# Patient Record
Sex: Female | Born: 1945 | Race: White | Hispanic: No | Marital: Married | Smoking: Former smoker
Health system: Southern US, Community
[De-identification: ages and names within clinical notes are randomized; demographics above are authoritative.]

## PROBLEM LIST (undated history)

## (undated) DIAGNOSIS — F32A Depression, unspecified: Secondary | ICD-10-CM

## (undated) DIAGNOSIS — Z9889 Other specified postprocedural states: Secondary | ICD-10-CM

## (undated) DIAGNOSIS — Z87442 Personal history of urinary calculi: Secondary | ICD-10-CM

## (undated) DIAGNOSIS — R112 Nausea with vomiting, unspecified: Secondary | ICD-10-CM

---

## 1998-04-20 ENCOUNTER — Other Ambulatory Visit: Admission: RE | Admit: 1998-04-20 | Discharge: 1998-04-20 | Payer: Self-pay | Admitting: Gynecology

## 1998-10-04 ENCOUNTER — Emergency Department (HOSPITAL_COMMUNITY): Admission: EM | Admit: 1998-10-04 | Discharge: 1998-10-05 | Payer: Self-pay | Admitting: Emergency Medicine

## 1999-04-08 ENCOUNTER — Other Ambulatory Visit: Admission: RE | Admit: 1999-04-08 | Discharge: 1999-04-08 | Payer: Self-pay | Admitting: Gynecology

## 2000-01-14 ENCOUNTER — Ambulatory Visit: Admission: RE | Admit: 2000-01-14 | Discharge: 2000-01-14 | Payer: Self-pay | Admitting: Gynecology

## 2000-04-22 ENCOUNTER — Ambulatory Visit: Admission: RE | Admit: 2000-04-22 | Discharge: 2000-04-22 | Payer: Self-pay | Admitting: Gynecology

## 2000-07-16 ENCOUNTER — Other Ambulatory Visit: Admission: RE | Admit: 2000-07-16 | Discharge: 2000-07-16 | Payer: Self-pay | Admitting: Gynecology

## 2000-09-01 HISTORY — PX: ABDOMINAL HYSTERECTOMY: SHX81

## 2000-09-15 ENCOUNTER — Inpatient Hospital Stay (HOSPITAL_COMMUNITY): Admission: RE | Admit: 2000-09-15 | Discharge: 2000-09-16 | Payer: Self-pay | Admitting: Gynecology

## 2000-09-15 ENCOUNTER — Encounter (INDEPENDENT_AMBULATORY_CARE_PROVIDER_SITE_OTHER): Payer: Self-pay

## 2001-07-14 ENCOUNTER — Encounter: Payer: Self-pay | Admitting: Family Medicine

## 2001-07-14 ENCOUNTER — Encounter: Admission: RE | Admit: 2001-07-14 | Discharge: 2001-07-14 | Payer: Self-pay | Admitting: Family Medicine

## 2001-08-18 ENCOUNTER — Other Ambulatory Visit: Admission: RE | Admit: 2001-08-18 | Discharge: 2001-08-18 | Payer: Self-pay | Admitting: Gynecology

## 2003-01-11 ENCOUNTER — Other Ambulatory Visit: Admission: RE | Admit: 2003-01-11 | Discharge: 2003-01-11 | Payer: Self-pay | Admitting: Gynecology

## 2004-03-05 ENCOUNTER — Other Ambulatory Visit: Admission: RE | Admit: 2004-03-05 | Discharge: 2004-03-05 | Payer: Self-pay | Admitting: Gynecology

## 2005-07-07 ENCOUNTER — Other Ambulatory Visit: Admission: RE | Admit: 2005-07-07 | Discharge: 2005-07-07 | Payer: Self-pay | Admitting: Gynecology

## 2006-07-30 ENCOUNTER — Other Ambulatory Visit: Admission: RE | Admit: 2006-07-30 | Discharge: 2006-07-30 | Payer: Self-pay | Admitting: Gynecology

## 2006-09-01 HISTORY — PX: SHOULDER ARTHROSCOPY: SHX128

## 2007-02-11 ENCOUNTER — Inpatient Hospital Stay (HOSPITAL_COMMUNITY): Admission: EM | Admit: 2007-02-11 | Discharge: 2007-02-12 | Payer: Self-pay | Admitting: Emergency Medicine

## 2007-04-28 ENCOUNTER — Encounter: Admission: RE | Admit: 2007-04-28 | Discharge: 2007-05-27 | Payer: Self-pay | Admitting: Orthopedic Surgery

## 2007-09-06 ENCOUNTER — Other Ambulatory Visit: Admission: RE | Admit: 2007-09-06 | Discharge: 2007-09-06 | Payer: Self-pay | Admitting: Gynecology

## 2011-01-14 NOTE — Consult Note (Signed)
Carly [name], Carly [name] NO.:  [id number]   MEDICAL RECORD NO.:  [id number]          PATIENT TYPE:  EMS   LOCATION:  MAJO                         FACILITY:  [facility]   PHYSICIAN:  Georges Lynch. Gioffre, M.D.DATE OF BIRTH:  1946-08-31   DATE OF CONSULTATION:  DATE OF DISCHARGE:                                 CONSULTATION   I got called to [facility] to see her at midnight this evening  a little after midnight on February 11, 2007.  She was coming down the steps  at home, fell, and injured her right ankle and then had a minimal injury  to the left ankle.  Apparently, she had a dislocated right ankle.  I was  called.  The emergency room physician said he reduced it, splinted her,  and sent her to X-ray.  I met her in the X-ray Department with her  husband.  No other injuries.   PAST HISTORY:  She has a history of depression.   ALLERGIES:  SHE IS ALLERGIC TO ZOVIRAX, SHE SAID.  A POSSIBLE ALLERGY TO  DILAUDID.   MEDICINES:  She is on Zoloft.   HEALTH:  She has been in good health.  No other specific medical  problems.   PHYSICAL EXAMINATION:  GENERAL:  On the physical examination, she is  alert and oriented but she is a little sedated obviously from the  Dilaudid and she has some itching from the Dilaudid.  VITAL SIGNS:  The blood pressure is 145/67.  The pulse was 57.  HEENT:  The exam of the head and neck was negative.  The mouth was  negative.  There are no lesions in the mouth.  BACK:  Negative.  NECK:  Negative.  EXTREMITIES:  The upper extremities were negative.  CHEST:  The lungs were clear.  HEART:  Normal sinus rhythm with no murmur.  BREASTS:  The breast exam was deferred.  ABDOMEN:  Abdominal exam was negative.  HIPS:  Negative.  EXTREMITIES:  Left lower extremity was normal except for very, very  minimal swelling over the lateral malleolar region.  Circulation was  intact.  The right lower extremity was in a sugar-tong splint.  Circulation in her  toes were intact.  Her sensation in her toes were  intact.  She can move her toes.  X-rays of her left ankle were normal.  X-rays of the right ankle shows a nondisplaced medial malleolar fracture  and a very, very minimally displaced posterior malleolus fracture on the  right.  The fibula on the right ankle showed a questionable nondisplaced  fracture.   IMPRESSION:  1. Mild sprain left ankle.  2. Trimalleolar fracture dislocation of the right ankle.   TREATMENT:  She is going to be admitted to the hospital, started on  Coumadin protocol, keep her leg elevated, and we will observe her and  perhaps let her go Friday.  I explained to her this evening she may need  to have an open reduction of that fragment if there is any displacement.           ______________________________  Georges Lynch. Gioffre,  M.D.     RAG/MEDQ  D:  02/11/2007  T:  02/11/2007  Job:  [id number]

## 2011-01-14 NOTE — Discharge Summary (Signed)
Carly, [name] NO.:  [id number]   MEDICAL RECORD NO.:  [id number]          PATIENT TYPE:  INP   LOCATION:  5707                         FACILITY:  [facility]   PHYSICIAN:  Georges Lynch. Gioffre, M.D.DATE OF BIRTH:  Jun 09, 1946   DATE OF ADMISSION:  02/11/2007  DATE OF DISCHARGE:  02/12/2007                               DISCHARGE SUMMARY   HISTORY OF PRESENT ILLNESS:  Patient is a 65 year old female with a  fractured dislocated right ankle, presented to the emergency room for  evaluation.  She fell down steps at home injuring her right ankle.  The  ER doctor reduced the ankle, placed her in a splint, took x-rays.  We  will admit her for pain control and Coumadin therapy.   ADMISSION DIAGNOSIS:  Right ankle fracture dislocation with closed  reduction and splinting.   DISCHARGE DIAGNOSES:  1. Right ankle fracture dislocation with closed reduction and      splinting.  2. Coumadin therapy for deep vein thrombosis prophylaxis.  3. Depression.   PROCEDURE:  None.   ER doctor did a closed reduction with adhesion.   HOSPITAL COURSE:  On February 11, 2007, patient was admitted to [facility] under the care of Dr. Worthy Rancher.  Patient was evaluated in  the emergency room after the ER doctor reduced a fracture dislocation of  her right ankle.  Post reduction films looks good in a posterior splint.  She was admitted for pain control and Coumadin dosing for DVT  prophylaxis.  Patient also complained of pain in her left ankle.  X-rays  were taken and showed to have no obvious fractures just probable severe  strain.   Patient then spent a 24-hour period on the orthopedic floor with her leg  elevated.  She was able to transition to from IV meds to p.o. meds well.  Patient was able to ambulate with physical therapy.  Patient was placed  on Coumadin for DVT prophylaxis.  On hospital day number two, patient  was orthopedically ready for discharge home, arrangements were  made for  outpatient home health and she was discharged in good condition.   LABS:  CBC on admission found WBCs 9.9, hemoglobin 10.9, hematocrit  32.7, platelets 243.  Routine chemistries on admission found sodium of  139, potassium of 3.7, glucose 108, BUN 23, creatinine 0.6.  Estimated  GFR greater than 60.   X-rays status post closed reduction of the trimalleolar fracture show  fracture fragments appeared to be near-anatomic allignment.   DISCHARGE INSTRUCTIONS:  1. Activities:  Patient is to be non-weightbearing right lower      extremity.  2. Patient is to use crutches or walker for ambulation.  3. Wound care:  Patient is to keep splint clean and dry.  4. Followup:  Patient needs a followup appointment with Dr. Darrelyn Hillock in      one week from discharge; patient is to call [phone] for      appointment.  5. Diet:  No restrictions.   MEDICATIONS:  1. Percocet 10/650 one tablet every six hours for pain.  2. Coumadin 5 mg once  a day unless changed by pharmacy.  3. Fosamax 70 mg once a week.  4. Zoloft 75 mg once a day.   Patient's condition upon discharge to home is good.      Jamelle Rushing, P.A.    ______________________________  Georges Lynch Darrelyn Hillock, M.D.    RWK/MEDQ  D:  04/18/2007  T:  04/18/2007  Job:  [id number]

## 2011-01-17 NOTE — H&P (Signed)
[facility]  Patient:    Carly [name], Carly [name]                  MRN: [id number] Adm. Date:  09/15/00 Attending:  Leatha Gilding. Mezer, M.D.                         History and Physical  ADMITTING DIAGNOSIS:  Ovarian cysts.  HISTORY OF PRESENT ILLNESS:  The patient is a 64 year old gravida 3, para 3 female, admitted with probable bilateral ovarian cysts for subtotal hysterectomy and bilateral salpingo-oophorectomy.  Approximately one year ago, the patient presented with a right lower quadrant ache and ultrasound examination revealed a small right ovarian cyst.  CA125 was 5.9 and repeat ultrasound revealed the simple cyst on the right to be persistent, with somewhat increase in size of the left ovarian cyst.  Further ultrasound was obtained in April 2001, where the ovarian cysts were still present and there was a question of a left hydrosalpinx.  The patient sought second opinion with Dr. Delsa Grana. Pippitt, Monte[facility] Hageman of [place], [place], who is a gynecological oncologist, and recommended a total abdominal hysterectomy and bilateral salpingo-oophorectomy.  The surgery was scheduled and then cancelled by the patient.  The surgery was rescheduled and the patient sought the consultation of Dr. Reuel Boom L. Clarke-Pearson, who also agreed with the surgery.  The patient strongly wished to leave her cervix and the pros and cons of subtotal hysterectomy were discussed in great detail with the patient. The patient had an episode of chest pain and was evaluated by Dr. Lacretia Nicks. Viann Fish, [place] Hageman., who determined that it was okay to proceed with surgery; there was no evidence of ischemia or infarction.  The patient had wished to defer surgery.  She was seen on August 06, 2000, at which time the left ovarian ______  not changed, the right ovarian cyst was somewhat larger but approximately the same size as in May of 2001.  The patient declined surgery again at that time.  She  then returned for consultation on August 21, 2000 and has decided to proceed with the surgery.  She wishes to undergo a subtotal hysterectomy and bilateral salpingo-oophorectomy. Dr. Rande Brunt. Clarke-Pearson will be available for standby if a gynecological malignancy is encountered.  The patient has undergone a bowel prep.  The risks of the surgery have been reviewed in extensive detail with the patient including, but not limited to, anesthesia, injury to the bowel, bladder or ureters, possible fistula formation, possible blood loss with transfusion and its sequelae, and possible infection.  Patient understands that if we do leave the cervix, that there is a chance that she will have bleeding and that should it be necessary to remove the cervix, that that may be a difficult procedure with potential complications.  Patient also understands that if it is surgically necessary to remove the cervix, that that will be done at the discretion of the surgeon.  A transverse incision will be made and the patient is aware of the limitations of that incision should a malignancy be encountered.  Postoperative restrictions and expectations have been discussed in detail.  PHYSICAL EXAMINATION  HEENT:  Negative.  LUNGS:  Clear.  HEART:  Without murmurs.  BREASTS:  Without mass or discharge.  ABDOMEN:  Soft and nontender.  PELVIC:  Pelvic exam reveals BUS, vagina and cervix to be normal.  The uterus is anteverted, normal in size and mobile.  The adnexa are without  palpable masses or tenderness.  RECTAL:  Negative.  EXTREMITIES:  Negative.  IMPRESSION:  Persistent ovarian cysts, ? hydrosalpinx.  PLAN:  Subtotal hysterectomy and bilateral salpingo-oophorectomy. DD:  09/15/00 TD:  09/15/00 Job: [id number]

## 2011-01-17 NOTE — Op Note (Signed)
[facility]  Patient:    Carly [name], Carly [name]              MRN: [id number] Proc. Date: 09/15/00 Adm. Date:  [id number] Attending:  Rolinda Roan CC:         Rande Brunt. Clarke-Pearson, M.D.   Operative Report  PREOPERATIVE DIAGNOSIS:  Bilateral ovarian cyst.  POSTOPERATIVE DIAGNOSIS:  Bilateral paraovarian cyst.  OPERATION PERFORMED:  Supracervical hysterectomy and bilateral salpingo-oophorectomy.  SURGEON:  Leatha Gilding. Mezer, M.D.  ASSISTANT:  Harl Bowie, M.D.  ANESTHESIA:  General endotracheal.  PREPARATION:  Betadine.  PROCEDURE:  The patient in the supine position, prepped and draped in routine fashion.  A Pfannenstiel incision was made through the skin and subcutaneous tissue.  The fascia and peritoneum were opened without difficulty.  Pelvic washings were obtained.  Brief exploration of upper abdomen was benign. Exploration of the pelvis revealed the uterus to be normal in size and the ovaries were normal bilaterally.  There were bilateral paraovarian cysts.  The left appearing to be simple.  The right was multiple and somewhat dark in color and may have infarcted some time in the past.  There was no visible endometriosis and there were adhesions of the bowel to the left infundibulopelvic ligament extending to the ovary.  These adhesions were taken down sharply in layers and cauterized as necessary.  The round ligaments were suture ligated with #1 chromic and divided.  The anterior leaf of the broad ligament was opened and the bladder was taken down without difficulty.  The infundibulopelvic ligament were isolated, clamped, cut and free tied with #1 chromic and suture ligated with #1 chromic.  The uterine arteries were clamped, cut and suture ligated with #1 chromic.  Part of the cardinal ligament were clamped, cut and suture ligated with #1 chromic.  The uterus was then excised with cautery and the endocervical canal was cored  out with cautery.  The remaining cervix was then covered over on itself approximating the serosa.  Several bleeding points in the bladder area were arrested with cautery.  The pelvis was washed with copious amounts of warm, lactated ringers solution and hemostasis noted to be intact.  The ureters were out of harms way bilaterally.  At the completion of the procedure, an effort was made to place the large bowel in the cul-de-sac, the omentum was brought down and the abdomen was closed in layers using a running 2-0 Vicryl on the peritoneum, running 0 Vicryl on the midline bilaterally on the fascia.  Hemostasis was assured in the subcutaneous tissue and the skin was closed with staples.  The estimated blood loss was approximately 100 cc.  The sponge and needle counts were correct x 2.  The patient tolerated the procedure well and was taken to recovery room in satisfactory condition. DD:  09/15/00 TD:  09/15/00 Job: [id number]

## 2011-01-17 NOTE — H&P (Signed)
[facility]  Patient:    Carly [name], Carly [name]              MRN: [id number] Adm. Date:  [id number] Attending:  Rolinda Roan                         History and Physical  PAST MEDICAL HISTORY Surgical:  Shoulder surgery.  Medical:  Noncontributory.  MEDICATIONS:  Vitamins and Fosamax.  ALLERGY:  ZOVIRAX.  SOCIAL HISTORY:  Smokes:  None.  ETOH:  Occasional.  FAMILY HISTORY:  Positive for question of carcinoma of the pancreas and diabetes. DD:  09/15/00 TD:  09/15/00 Job: [id number]

## 2011-03-06 ENCOUNTER — Other Ambulatory Visit: Payer: Self-pay | Admitting: Family Medicine

## 2011-03-06 ENCOUNTER — Ambulatory Visit
Admission: RE | Admit: 2011-03-06 | Discharge: 2011-03-06 | Disposition: A | Discharge status: Home / Self Care | Payer: Medicare Other | Source: Ambulatory Visit | Attending: Family Medicine | Admitting: Family Medicine

## 2011-03-06 DIAGNOSIS — IMO0002 Reserved for concepts with insufficient information to code with codable children: Secondary | ICD-10-CM

## 2011-06-19 LAB — COMPREHENSIVE METABOLIC PANEL
CO2: 30
Calcium: 8.3 — ABNORMAL LOW
Creatinine, Ser: 0.86
GFR calc non Af Amer: >60
Glucose, Bld: 109 — ABNORMAL HIGH

## 2011-06-19 LAB — PROTIME-INR: Prothrombin Time: 15.6 — ABNORMAL HIGH

## 2011-06-19 LAB — I-STAT 8, (EC8 V) (CONVERTED LAB)
BUN: 23
Bicarbonate: 27.8 — ABNORMAL HIGH
Glucose, Bld: 108 — ABNORMAL HIGH
Operator id: 282201
pCO2, Ven: 35.6 — ABNORMAL LOW

## 2011-06-19 LAB — CBC
HCT: 32.7 — ABNORMAL LOW
Hemoglobin: 10.9 — ABNORMAL LOW
MCHC: 33.4
MCV: 90.9
RBC: 3.59 — ABNORMAL LOW

## 2011-06-19 LAB — POCT I-STAT CREATININE
Creatinine, Ser: 0.9
Operator id: 282201

## 2012-04-19 ENCOUNTER — Other Ambulatory Visit: Payer: Self-pay | Admitting: Family Medicine

## 2012-04-19 DIAGNOSIS — R102 Pelvic and perineal pain: Secondary | ICD-10-CM

## 2012-04-19 DIAGNOSIS — M25551 Pain in right hip: Secondary | ICD-10-CM

## 2012-04-20 ENCOUNTER — Ambulatory Visit
Admission: RE | Admit: 2012-04-20 | Discharge: 2012-04-20 | Disposition: A | Discharge status: Home / Self Care | Payer: Medicare Other | Source: Ambulatory Visit | Attending: Family Medicine | Admitting: Family Medicine

## 2012-04-20 DIAGNOSIS — R102 Pelvic and perineal pain: Secondary | ICD-10-CM

## 2013-04-13 DIAGNOSIS — F325 Major depressive disorder, single episode, in full remission: Secondary | ICD-10-CM | POA: Insufficient documentation

## 2013-04-13 DIAGNOSIS — G4700 Insomnia, unspecified: Secondary | ICD-10-CM | POA: Insufficient documentation

## 2013-04-13 DIAGNOSIS — F32A Depression, unspecified: Secondary | ICD-10-CM | POA: Insufficient documentation

## 2013-04-13 DIAGNOSIS — M7750 Other enthesopathy of unspecified foot: Secondary | ICD-10-CM | POA: Insufficient documentation

## 2013-04-13 DIAGNOSIS — M8580 Other specified disorders of bone density and structure, unspecified site: Secondary | ICD-10-CM | POA: Insufficient documentation

## 2015-08-10 ENCOUNTER — Encounter: Payer: Self-pay | Admitting: Gastroenterology

## 2015-10-08 DIAGNOSIS — Z9181 History of falling: Secondary | ICD-10-CM | POA: Insufficient documentation

## 2015-11-07 DIAGNOSIS — R928 Other abnormal and inconclusive findings on diagnostic imaging of breast: Secondary | ICD-10-CM | POA: Insufficient documentation

## 2017-02-06 ENCOUNTER — Ambulatory Visit: Payer: Self-pay | Admitting: Podiatry

## 2017-02-06 ENCOUNTER — Ambulatory Visit (INDEPENDENT_AMBULATORY_CARE_PROVIDER_SITE_OTHER): Payer: Medicare Other | Admitting: Podiatry

## 2017-02-06 ENCOUNTER — Encounter: Payer: Self-pay | Admitting: Podiatry

## 2017-02-06 DIAGNOSIS — L84 Corns and callosities: Secondary | ICD-10-CM

## 2017-02-06 DIAGNOSIS — M79672 Pain in left foot: Secondary | ICD-10-CM

## 2017-02-06 DIAGNOSIS — M79671 Pain in right foot: Secondary | ICD-10-CM | POA: Diagnosis not present

## 2017-02-10 NOTE — Progress Notes (Signed)
Subjective:    Patient ID: Carly [name], female   DOB: 71 y.o.   MRN: [id number]   HPI 71 year old female presents the office if concerns of calluses the balls of both of her feet. This has been ongoing for greater than 10 years. She states that she is to call the office regular for trims but has not been some time. She states that over the last couple months the calluses, worse causing pain with pressure. She denies any swelling or redness. No recent injury or trauma. She has no other concerns today.   Review of Systems  All other systems reviewed and are negative.       Objective:  Physical Exam General: AAO x3, NAD  Dermatological: Diffuse hyperkeratotic lesions bilateral submetatarsal 2/3. Upon debridement there is no underlying ulceration, drainage or any clinical signs of infection. There is no open lesions identified otherwise there is no other pre-ulcerative lesions.  Vascular:  DP/PT pulses 2/4, CRT less than 3 seconds. There is no pain with calf compression, swelling, warmth, erythema.   Neruologic: Grossly intact via light touch bilateral. Vibratory intact via tuning fork bilateral. Protective threshold with Semmes Wienstein monofilament intact to all pedal sites bilateral.   Musculoskeletal: Prominent metatarsal heads plantarly with atrophy of the fat pad. Muscular strength 5/5 in all groups tested bilateral.  Gait: Unassisted, Nonantalgic.      Assessment:     Hyperkeratotic lesions bilateral submetatarsal     Plan:     -Treatment options discussed including all alternatives, risks, and complications -Etiology of symptoms were discussed -Hyperkeratotic lesions were sharply debrided 2 without complications or bleeding. Offloading pads were dispensed. Discussed with her and accommodative type orthotic if symptoms are not improving with a metatarsal pads. As needed. Call any questions or concerns.   Ovid CurdMatthew Raquelle Pietro, DPM

## 2018-06-21 ENCOUNTER — Other Ambulatory Visit: Payer: Self-pay | Admitting: Family Medicine

## 2018-06-21 DIAGNOSIS — M26623 Arthralgia of bilateral temporomandibular joint: Secondary | ICD-10-CM

## 2018-07-05 ENCOUNTER — Other Ambulatory Visit: Payer: Self-pay

## 2018-07-06 ENCOUNTER — Other Ambulatory Visit: Payer: Self-pay | Admitting: Dentistry

## 2018-07-06 DIAGNOSIS — M26623 Arthralgia of bilateral temporomandibular joint: Secondary | ICD-10-CM

## 2018-07-13 ENCOUNTER — Ambulatory Visit
Admission: RE | Admit: 2018-07-13 | Discharge: 2018-07-13 | Disposition: A | Discharge status: Home / Self Care | Payer: Medicare Other | Source: Ambulatory Visit | Attending: Dentistry | Admitting: Dentistry

## 2018-07-13 DIAGNOSIS — M26623 Arthralgia of bilateral temporomandibular joint: Secondary | ICD-10-CM

## 2019-03-03 DIAGNOSIS — R519 Headache, unspecified: Secondary | ICD-10-CM | POA: Insufficient documentation

## 2019-09-26 ENCOUNTER — Ambulatory Visit: Payer: Medicare Other | Attending: Internal Medicine

## 2019-09-26 DIAGNOSIS — Z23 Encounter for immunization: Secondary | ICD-10-CM | POA: Insufficient documentation

## 2019-09-26 NOTE — Progress Notes (Signed)
   Covid-19 Vaccination Clinic  Name:  Carly [name]    MRN: [id number] DOB: 01/04/1946  09/26/2019  Carly [name] was observed post Covid-19 immunization for 15 minutes without incidence. She was provided with Vaccine Information Sheet and instruction to access the V-Safe system.   Carly [name] was instructed to call 911 with any severe reactions post vaccine: Marland Kitchen Difficulty breathing  . Swelling of your face and throat  . A fast heartbeat  . A bad rash all over your body  . Dizziness and weakness    Immunizations Administered    Name Date Dose VIS Date Route   Pfizer COVID-19 Vaccine 09/26/2019  1:49 PM 0.3 mL 08/12/2019 Intramuscular   Manufacturer: [employer], [employer]   Lot: [id number]   NDC: [address]-1

## 2019-10-15 ENCOUNTER — Ambulatory Visit: Payer: Self-pay

## 2019-10-17 ENCOUNTER — Ambulatory Visit: Payer: Medicare Other | Attending: Internal Medicine

## 2019-10-17 DIAGNOSIS — Z23 Encounter for immunization: Secondary | ICD-10-CM

## 2019-10-17 NOTE — Progress Notes (Signed)
   Covid-19 Vaccination Clinic  Name:  Shaton Lore    MRN: [id number] DOB: February 18, 1946  10/17/2019  Ms. Emert was observed post Covid-19 immunization for 15 minutes without incidence. She was provided with Vaccine Information Sheet and instruction to access the V-Safe system.   Ms. Flanagin was instructed to call 911 with any severe reactions post vaccine: Marland Kitchen Difficulty breathing  . Swelling of your face and throat  . A fast heartbeat  . A bad rash all over your body  . Dizziness and weakness    Immunizations Administered    Name Date Dose VIS Date Route   Pfizer COVID-19 Vaccine 10/17/2019 11:46 AM 0.3 mL 08/12/2019 Intramuscular   Manufacturer: [employer], [employer]   Lot: [id number]   NDC: [address]-4

## 2020-04-19 ENCOUNTER — Other Ambulatory Visit: Payer: Self-pay | Admitting: Family Medicine

## 2020-04-19 ENCOUNTER — Other Ambulatory Visit: Payer: Self-pay

## 2020-04-19 ENCOUNTER — Ambulatory Visit
Admission: RE | Admit: 2020-04-19 | Discharge: 2020-04-19 | Disposition: A | Discharge status: Home / Self Care | Payer: Medicare Other | Source: Ambulatory Visit | Attending: Family Medicine | Admitting: Family Medicine

## 2020-04-19 DIAGNOSIS — R059 Cough, unspecified: Secondary | ICD-10-CM

## 2020-04-19 DIAGNOSIS — R05 Cough: Secondary | ICD-10-CM

## 2020-05-15 DIAGNOSIS — R399 Unspecified symptoms and signs involving the genitourinary system: Secondary | ICD-10-CM | POA: Insufficient documentation

## 2020-12-31 ENCOUNTER — Other Ambulatory Visit: Payer: Self-pay | Admitting: Urology

## 2020-12-31 DIAGNOSIS — N2889 Other specified disorders of kidney and ureter: Secondary | ICD-10-CM

## 2021-01-03 ENCOUNTER — Other Ambulatory Visit: Payer: Medicare Other

## 2021-01-03 ENCOUNTER — Encounter: Payer: Self-pay | Admitting: *Deleted

## 2021-01-03 ENCOUNTER — Ambulatory Visit
Admission: RE | Admit: 2021-01-03 | Discharge: 2021-01-03 | Disposition: A | Discharge status: Home / Self Care | Payer: Medicare Other | Source: Ambulatory Visit | Attending: Urology | Admitting: Urology

## 2021-01-03 DIAGNOSIS — N2889 Other specified disorders of kidney and ureter: Secondary | ICD-10-CM

## 2021-01-03 HISTORY — PX: IR RADIOLOGIST EVAL & MGMT: IMG5224

## 2021-01-03 NOTE — Consult Note (Signed)
Chief Complaint: Patient was seen in consultation today for a right renal lesion at the request of Herrick,Benjamin   Referring Physician(s): Herrick,Benjamin W  History of Present Illness: Carly [name] is a 75 y.o. female with an incidentally found right renal lesion.  Patient had an abdominal ultrasound on 05/30/2020 that demonstrated an indeterminate 1.4 cm right renal lesion.  Subsequently, the patient had a MRI of the abdomen without contrast that demonstrated a suspicious 1.3 cm lesion.  Patient had a follow-up CT of the abdomen with and without contrast that demonstrates an enhancing lesion in the right kidney measuring up to 1.4 cm.  Overall, the patient is healthy.  Her main complaint is a recent fall and has right knee pain.  Her energy level is not what it used to be but she is able to do her normal daily activities.  She is also taking care for her husband who has Alzheimer's disease.  She denies breathing problems, chest pain, abdominal pain, flank pain, dysuria or hematuria.  No significant change in her appetite or weight.  Patient recently saw Dr. Marlou Porch and discussed treatment options versus observation.  Patient is looking for more information with regards to treatment options.  Past medical history includes hysterectomy.  Creatinine 0.88 on 07/10/2020.  Allergies: Acyclovir  Medications: Prior to Admission medications   Medication Sig Start Date End Date Taking? Authorizing Provider  sertraline (ZOLOFT) 50 MG tablet Take 50 mg by mouth. 10/08/15   [provider]     No family history on file.  Social History   Socioeconomic History  . Marital status: Married    Spouse name: Not on file  . Number of children: Not on file  . Years of education: Not on file  . Highest education level: Not on file  Occupational History  . Not on file  Tobacco Use  . Smoking status: Never Smoker  . Smokeless tobacco: Current User  Substance and Sexual  Activity  . Alcohol use: No  . Drug use: No  . Sexual activity: Not on file  Other Topics Concern  . Not on file  Social History Narrative  . Not on file   Social Determinants of Health   Financial Resource Strain: Not on file  Food Insecurity: Not on file  Transportation Needs: Not on file  Physical Activity: Not on file  Stress: Not on file  Social Connections: Not on file    ECOG Status: 0 - Asymptomatic  Review of Systems: A 12 point ROS discussed and pertinent positives are indicated in the HPI above.  All other systems are negative.  Review of Systems  Constitutional: Negative.   Respiratory: Negative.   Cardiovascular: Negative.   Gastrointestinal: Negative.   Genitourinary: Negative.   Musculoskeletal:       Right knee pain    Vital Signs: BP (!) 157/77 (BP Location: Right Arm)   Pulse (!) 53   SpO2 98%   Physical Exam Constitutional:      Appearance: Normal appearance. She is not ill-appearing.  Cardiovascular:     Rate and Rhythm: Normal rate and regular rhythm.  Pulmonary:     Effort: Pulmonary effort is normal.     Breath sounds: Normal breath sounds.  Abdominal:     General: Abdomen is flat.     Palpations: Abdomen is soft.     Tenderness: There is no abdominal tenderness.  Neurological:     General: No focal deficit present.     Mental  Status: She is alert and oriented to person, place, and time.        Imaging:  CLINICAL DATA: HX RT. RENAL MASS   EXAM:  CT ABDOMEN WITHOUT AND WITH CONTRAST   TECHNIQUE:  Multidetector CT imaging of the abdomen was performed following the  standard protocol before and following the bolus administration of  intravenous contrast.   CONTRAST: 125 cc Omnipaque   COMPARISON: MRI without contrast 07/12/2020.   FINDINGS:  Lower chest: Lung bases are clear.   Hepatobiliary: No focal hepatic lesion. No biliary duct dilatation.  Common bile duct is normal.   Pancreas: Pancreas is normal. No ductal  dilatation. No pancreatic  inflammation.   Spleen: Normal spleen   Adrenals/urinary tract: Adrenal glands normal.   With the medial cortex of the mid LEFT kidney rounded lesion  measures 1.4 cm. This lesion is intermediate density on noncontrast  imaging (series 2) and demonstrates post-contrast enhancement (CT  series 6, image 41).   No additional enhancing lesions are present.   Nonobstructing calculus in the mid LEFT kidney.   Proximal ureters normal   Stomach/Bowel: Stomach and limited view of the bowel is  unremarkable.   Vascular/Lymphatic: No aorta normal caliber. No upper abdominal  adenopathy.   Reproductive:   Other: No free fluid.   Musculoskeletal: No aggressive osseous lesion.   IMPRESSION:  1. Enhancing solid lesion in the medial aspect of the RIGHT kidney  is most consistent with a benign or malignant renal neoplasm.  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  2. No significant change in size from 07/12/2020.  3. No lymphadenopathy.   These results will be called to the ordering clinician or  representative by the Radiologist Assistant, and communication  documented in the PACS or [employer].    Electronically Signed  By: Genevive Bi M.D.  On: 12/12/2020 14:31     Labs:  CBC: No results for input(s): WBC, HGB, HCT, PLT in the last 8760 hours.  COAGS: No results for input(s): INR, APTT in the last 8760 hours.  BMP: No results for input(s): NA, K, CL, CO2, GLUCOSE, BUN, CALCIUM, CREATININE, GFRNONAA, GFRAA in the last 8760 hours.  Invalid input(s): CMP  LIVER FUNCTION TESTS: No results for input(s): BILITOT, AST, ALT, ALKPHOS, PROT, ALBUMIN in the last 8760 hours.  TUMOR MARKERS: No results for input(s): AFPTM, CEA, CA199, CHROMGRNA in the last 8760 hours.  Assessment and Plan:  75 year old with with an indeterminate but suspicious right renal lesion measuring up  to 1.4 cm.  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  Explained to patient that this likely represents a small renal cell carcinoma but  cannot completely exclude a benign neoplasm.  We discussed the different management options for a lesion of this size.  I think it is reasonable to continue with surveillance of this lesion since there has been minimal change in size since September 2021.  We also discussed percutaneous biopsy versus percutaneous ablation.  The lesion measures 1.4 cm and is located along the posterior right upper pole.  Percutaneous ablation of this lesion is feasible.  Percutaneous ablation would have some technical difficulties based on the close proximity to the right hemidiaphragm but I think it could be performed safely.  My preference would be to treat the lesion with cryoablation.  Explained that the chance of metastatic disease is very  low with a lesion of this small size.  Explained to the patient that the chance of recurrence or incomplete treatment after ablation is typically less with smaller lesions.  Discussed the cryoablation procedure in depth including the use of general anesthesia and staying in the hospital overnight for observation after the procedure.  We discussed the possible risks of a percutaneous ablation which would include bleeding, infection, loss of renal function and incomplete treatment.  Explained to the patient that we could biopsy the lesion before or during an ablation procedure.  I believe the patient has a very good understanding of the treatment/management options for her small right renal lesion.  At this time, I would be comfortable monitoring this lesion because I think there has been minimal change since September 2021.  However, if the patient would like to pursue percutaneous ablation or biopsy of the lesion, I think that would be reasonable  as well.  Patient will contact us if she would like [place] to schedule her for the percutaneous ablation.  Thank you for this interesting consult.  I greatly enjoyed meeting Elisheba Mcdonnell and look forward to participating in their care.  A copy of this report was sent to the requesting provider on this date.  Electronically Signed: Arn Medal 01/03/2021, 12:24 PM   I spent a total of  40 Minutes   in face to face in clinical consultation, greater than 50% of which was counseling/coordinating care for a right renal lesion

## 2021-01-11 ENCOUNTER — Other Ambulatory Visit (HOSPITAL_COMMUNITY): Payer: Self-pay | Admitting: Diagnostic Radiology

## 2021-01-11 DIAGNOSIS — N2889 Other specified disorders of kidney and ureter: Secondary | ICD-10-CM

## 2021-03-14 NOTE — Patient Instructions (Addendum)
DUE TO COVID-19 ONLY ONE VISITOR IS ALLOWED TO COME WITH YOU AND STAY IN THE WAITING ROOM ONLY DURING PRE OP AND PROCEDURE DAY OF SURGERY. THE 2VISITORS  MAY VISIT WITH YOU AFTER SURGERY IN YOUR PRIVATE ROOM DURING VISITING HOURS ONLY!                 Ouita Nish     Your procedure is scheduled on: 03/20/21   Report to [facility] Main  Entrance   Report to admitting at10:00  AM     Call this number if you have problems the morning of surgery [phone]    Remember: Do not eat food or drink liquids :After Midnight.   BRUSH YOUR TEETH MORNING OF SURGERY AND RINSE YOUR MOUTH OUT, NO CHEWING GUM CANDY OR MINTS.     Take these medicines the morning of surgery with A SIP OF WATER: Zoloft                               You may not have any metal on your body including hair pins and              piercings  Do not wear jewelry, make-up, lotions, powders or perfumes, deodorant             Do not wear nail polish on your fingernails.  Do not shave  48 hours prior to surgery.               Do not bring valuables to the hospital. [facility] IS NOT             RESPONSIBLE   FOR VALUABLES.  Contacts, dentures or bridgework may not be worn into surgery.       Patients discharged the day of surgery will not be allowed to drive home.  IF YOU ARE HAVING SURGERY AND GOING HOME THE SAME DAY, YOU MUST HAVE AN ADULT TO DRIVE YOU HOME AND BE WITH YOU FOR 24 HOURS. YOU MAY GO HOME BY TAXI OR UBER OR ORTHERWISE, BUT AN ADULT MUST ACCOMPANY YOU HOME AND STAY WITH YOU FOR 24 HOURS.  Name and phone number of your driver:  Special Instructions: N/A              Please read over the following fact sheets you were given: _____________________________________________________________________             [facility] - Preparing for Surgery Before surgery, you can play an important role.  Because skin is not sterile, your skin needs to be as free of germs as possible.  You can reduce  the number of germs on your skin by washing with CHG (chlorahexidine gluconate) soap before surgery.  CHG is an antiseptic cleaner which kills germs and bonds with the skin to continue killing germs even after washing. Please DO NOT use if you have an allergy to CHG or antibacterial soaps.  If your skin becomes reddened/irritated stop using the CHG and inform your nurse when you arrive at Short Stay. Do not shave (including legs and underarms) for at least 48 hours prior to the first CHG shower.   Please follow these instructions carefully:  1.  Shower with CHG Soap the night before surgery and the  morning of Surgery.  2.  If you choose to wash your hair, wash your hair first as usual with your  normal  shampoo.  3.  After you shampoo, rinse your hair and body thoroughly to remove the  shampoo.                                        4.  Use CHG as you would any other liquid soap.  You can apply chg directly  to the skin and wash                       Gently with a scrungie or clean washcloth.  5.  Apply the CHG Soap to your body ONLY FROM THE NECK DOWN.   Do not use on face/ open                           Wound or open sores. Avoid contact with eyes, ears mouth and genitals (private parts).                       Wash face,  Genitals (private parts) with your normal soap.             6.  Wash thoroughly, paying special attention to the area where your surgery  will be performed.  7.  Thoroughly rinse your body with warm water from the neck down.  8.  DO NOT shower/wash with your normal soap after using and rinsing off  the CHG Soap.             9.  Pat yourself dry with a clean towel.            10.  Wear clean pajamas.            11.  Place clean sheets on your bed the night of your first shower and do not  sleep with pets. Day of Surgery : Do not apply any lotions/deodorants the morning of surgery.  Please wear clean clothes to the hospital/surgery center.  FAILURE TO FOLLOW THESE INSTRUCTIONS  MAY RESULT IN THE CANCELLATION OF YOUR SURGERY PATIENT SIGNATURE_________________________________  NURSE SIGNATURE__________________________________  ________________________________________________________________________

## 2021-03-15 ENCOUNTER — Encounter (HOSPITAL_COMMUNITY): Payer: Self-pay

## 2021-03-15 ENCOUNTER — Encounter (HOSPITAL_COMMUNITY)
Admission: RE | Admit: 2021-03-15 | Discharge: 2021-03-15 | Disposition: A | Discharge status: Home / Self Care | Payer: Medicare Other | Source: Ambulatory Visit | Attending: Diagnostic Radiology | Admitting: Diagnostic Radiology

## 2021-03-15 ENCOUNTER — Other Ambulatory Visit: Payer: Self-pay

## 2021-03-15 DIAGNOSIS — Z01812 Encounter for preprocedural laboratory examination: Secondary | ICD-10-CM | POA: Diagnosis present

## 2021-03-15 HISTORY — DX: Other specified postprocedural states: Z98.890

## 2021-03-15 HISTORY — DX: Depression, unspecified: F32.A

## 2021-03-15 HISTORY — DX: Nausea with vomiting, unspecified: R11.2

## 2021-03-15 HISTORY — DX: Personal history of urinary calculi: Z87.442

## 2021-03-15 LAB — BASIC METABOLIC PANEL
Anion gap: 6 (ref 5–15)
BUN: 18 mg/dL (ref 8–23)
CO2: 26 mmol/L (ref 22–32)
Calcium: 8.8 mg/dL — ABNORMAL LOW (ref 8.9–10.3)
Chloride: 104 mmol/L (ref 98–111)
Creatinine, Ser: 0.71 mg/dL (ref 0.44–1.00)
GFR, Estimated: >60 mL/min (ref >60)
Glucose, Bld: 99 mg/dL (ref 70–99)
Potassium: 4.2 mmol/L (ref 3.5–5.1)
Sodium: 136 mmol/L (ref 135–145)

## 2021-03-15 LAB — CBC
HCT: 37 % (ref 36.0–46.0)
Hemoglobin: 12.2 g/dL (ref 12.0–15.0)
MCH: 31 pg (ref 26.0–34.0)
MCHC: 33 g/dL (ref 30.0–36.0)
MCV: 93.9 fL (ref 80.0–100.0)
Platelets: 306 10*3/uL (ref 150–400)
RBC: 3.94 MIL/uL (ref 3.87–5.11)
RDW: 13.9 % (ref 11.5–15.5)
WBC: 6.4 10*3/uL (ref 4.0–10.5)
nRBC: 0 % (ref 0.0–0.2)

## 2021-03-15 NOTE — Progress Notes (Signed)
COVID Vaccine Completed:Yes Date COVID Vaccine completed:10/17/19-booster 08/14/20 COVID vaccine manufacturer: Pfizer      PCP - Dr. Norma Fredrickson LOV 01/21/21 Cardiologist - no  Chest x-ray - no EKG - no Stress Test - no ECHO - no Cardiac Cath - no Pacemaker/ICD device last checked:NA  Sleep Study - no CPAP -   Fasting Blood Sugar - NA Checks Blood Sugar _____ times a day  Blood Thinner Instructions:NA Aspirin Instructions: Last Dose:  Anesthesia review: yes IR patient  Patient denies shortness of breath, fever, cough and chest pain at PAT appointment Pt sometimes get SOB while gardening in the heat.  Patient verbalized understanding of instructions that were given to them at the PAT appointment. Patient was also instructed that they will need to review over the PAT instructions again at home before surgery. yes

## 2021-03-19 ENCOUNTER — Other Ambulatory Visit: Payer: Self-pay | Admitting: Student

## 2021-03-19 NOTE — H&P (Signed)
Chief Complaint: Patient was seen in consultation today for right renal lesion ablation at the request of Berniece Salines  Referring Physician(s): Berniece Salines  Supervising Physician: Richarda Overlie  Patient Status: [facility] - Out-pt  History of Present Illness: Monzerrat Wellen is a 75 y.o. female with past medical history POMP, depression, nephrolithiasis, and right renal lesion who was referred to Dr. Lowella Dandy for possible ablation of the right renal lesion.  Patient had a consultation visit with Dr. Lowella Dandy on 01/03/2021, deemed to be a appropriate candidate for a right renal lesion biopsy followed by cryoablation. After thorough discussion and shared decision making, patient decided to proceed with biopsy and cryoablation of the right renal lesion.    Patient presents to [facility] IR today for the procedure.  Patient laying in bed, not in acute distress.  Denise headache, fever, chills, shortness of breath, cough, chest pain, abdominal pain, nausea ,vomiting, and bleeding.   Past Medical History:  Diagnosis Date   Depression    History of kidney stones    PONV (postoperative nausea and vomiting)     Past Surgical History:  Procedure Laterality Date   ABDOMINAL HYSTERECTOMY  2002   IR RADIOLOGIST EVAL & MGMT  01/03/2021   SHOULDER ARTHROSCOPY Right 2008    Allergies: Acyclovir  Medications: Prior to Admission medications   Medication Sig Start Date End Date Taking? Authorizing Provider  Biotin w/ Vitamins C & E (HAIR/SKIN/NAILS PO) Take 1 tablet by mouth daily.    [provider]  Omega-3 Fatty Acids (FISH OIL) 1200 MG CAPS Take 1,200 mg by mouth daily.    [provider]  OVER THE COUNTER MEDICATION Take 2-3 tablets by mouth daily. Nitric Oxide    [provider]  sertraline (ZOLOFT) 50 MG tablet Take 50 mg by mouth daily. 10/08/15   [provider]  zolpidem (AMBIEN) 5 MG tablet Take 5 mg by mouth at bedtime as needed for sleep.    [provider]     History reviewed. No pertinent family history.  Social History   Socioeconomic History   Marital status: Married    Spouse name: Not on file   Number of children: Not on file   Years of education: Not on file   Highest education level: Not on file  Occupational History   Not on file  Tobacco Use   Smoking status: Former    Years: 5.00    Types: Cigarettes    Quit date: 1970    Years since quitting: 52.5   Smokeless tobacco: Never  Vaping Use   Vaping Use: Never used  Substance and Sexual Activity   Alcohol use: Yes    Comment: rare   Drug use: No   Sexual activity: Not on file  Other Topics Concern   Not on file  Social History Narrative   Not on file   Social Determinants of Health   Financial Resource Strain: Not on file  Food Insecurity: Not on file  Transportation Needs: Not on file  Physical Activity: Not on file  Stress: Not on file  Social Connections: Not on file     Review of Systems: A 12 point ROS discussed and pertinent positives are indicated in the HPI above.  All other systems are negative.   Vital Signs: There were no vitals taken for this visit.  Physical Exam  Vitals and nursing note reviewed.  Constitutional:      General: He is not in acute distress.  Appearance: Normal appearance.  HENT:     Head: Normocephalic and atraumatic.     Mouth/Throat:     Mouth: Mucous membranes are moist.     Pharynx: Oropharynx is clear.  Cardiovascular:     Rate and Rhythm: Normal rate and regular rhythm.     Pulses: Normal pulses.     Heart sounds: Normal heart sounds.  Pulmonary:     Effort: Pulmonary effort is normal.     Breath sounds: Normal breath sounds. No wheezing, rhonchi or rales.  Abdominal:     General: Bowel sounds are normal. There is no distension.     Palpations: Abdomen is soft.  Skin:    General: Skin is warm and dry.  Neurological:     Mental Status: He is alert and oriented to person, place, and time.   Psychiatric:        Mood and Affect: Mood normal.        Behavior: Behavior normal.    MD Evaluation Airway: WNL Heart: WNL Abdomen: WNL Chest/ Lungs: WNL ASA  Classification: 3 Mallampati/Airway Score: Two  Imaging: No results found.  Labs:  CBC: Recent Labs    03/15/21 1336  WBC 6.4  HGB 12.2  HCT 37.0  PLT 306    COAGS: Recent Labs    03/20/21 1040  INR 1.0    BMP: Recent Labs    03/15/21 1336  NA 136  K 4.2  CL 104  CO2 26  GLUCOSE 99  BUN 18  CALCIUM 8.8*  CREATININE 0.71  GFRNONAA >60    LIVER FUNCTION TESTS: No results for input(s): BILITOT, AST, ALT, ALKPHOS, PROT, ALBUMIN in the last 8760 hours.  TUMOR MARKERS: No results for input(s): AFPTM, CEA, CA199, CHROMGRNA in the last 8760 hours.  Assessment and Plan: 75 y.o. female with right renal lesion who was referred to Dr. Lowella Dandy for possible ablation.  Patient had a consultation visit with Dr. Lowella Dandy on 01/03/2021, deemed to be a good candidate for biopsy and cryoablation of the right renal lesion.  After thorough discussion and shared decision making, patient decided to proceed with the biopsy and cryoablation.  Patient presents to [facility] IR today for the procedure. N.p.o. since midnight VS HTN with 160/81  INR 1.0  BMP on 03/15/2021 showed mild hypocalcemia 8.8, unremarkable otherwise CBC on 03/15/2021 all within normal limit Not on anticoagulation antiplatelet treatment  Risks and benefits of image guided renal cryoablation was discussed with the patient including, but not limited to, failure to treat entire lesion, bleeding, infection, damage to adjacent structures, hematuria, urine leak, decrease in renal function or post procedural neuropathy.  All of the patient's questions were answered and the patient is agreeable to proceed. Consent signed and in chart.   Thank you for this interesting consult.  I greatly enjoyed meeting Evangelynn Lochridge and look forward to participating in their  care.  A copy of this report was sent to the requesting provider on this date.  Electronically Signed: Willette Brace, PA-C 03/20/2021, 12:02 PM   I spent a total of    25 Minutes in face to face in clinical consultation, greater than 50% of which was counseling/coordinating care for biopsy and cryoablation of right renal lesion

## 2021-03-19 NOTE — H&P (View-Only) (Signed)
 Chief Complaint: Patient was seen in consultation today for right renal lesion ablation at the request of Herrick, Benjamin  Referring Physician(s): Herrick, Benjamin  Supervising Physician: Henn, Adam  Patient Status: [facility] - Out-pt  History of Present Illness: Aleathea Carolyn Shiflett is a 75 y.o. female with past medical history POMP, depression, nephrolithiasis, and right renal lesion who was referred to Dr. Henn for possible ablation of the right renal lesion.  Patient had a consultation visit with Dr. Henn on 01/03/2021, deemed to be a appropriate candidate for a right renal lesion biopsy followed by cryoablation. After thorough discussion and shared decision making, patient decided to proceed with biopsy and cryoablation of the right renal lesion.    Patient presents to [facility] today for the procedure.  Patient laying in bed, not in acute distress.  Denise headache, fever, chills, shortness of breath, cough, chest pain, abdominal pain, nausea ,vomiting, and bleeding.   Past Medical History:  Diagnosis Date   Depression    History of kidney stones    PONV (postoperative nausea and vomiting)     Past Surgical History:  Procedure Laterality Date   ABDOMINAL HYSTERECTOMY  2002   IR RADIOLOGIST EVAL & MGMT  01/03/2021   SHOULDER ARTHROSCOPY Right 2008    Allergies: Acyclovir  Medications: Prior to Admission medications   Medication Sig Start Date End Date Taking? Authorizing Provider  Biotin w/ Vitamins C & E (HAIR/SKIN/NAILS PO) Take 1 tablet by mouth daily.    [provider]  Omega-3 Fatty Acids (FISH OIL) 1200 MG CAPS Take 1,200 mg by mouth daily.    [provider]  OVER THE COUNTER MEDICATION Take 2-3 tablets by mouth daily. Nitric Oxide    [provider]  sertraline (ZOLOFT) 50 MG tablet Take 50 mg by mouth daily. 10/08/15   [provider]  zolpidem (AMBIEN) 5 MG tablet Take 5 mg by mouth at bedtime as needed for sleep.    [provider]     History reviewed. No pertinent family history.  Social History   Socioeconomic History   Marital status: Married    Spouse name: Not on file   Number of children: Not on file   Years of education: Not on file   Highest education level: Not on file  Occupational History   Not on file  Tobacco Use   Smoking status: Former    Years: 5.00    Types: Cigarettes    Quit date: 1970    Years since quitting: 52.5   Smokeless tobacco: Never  Vaping Use   Vaping Use: Never used  Substance and Sexual Activity   Alcohol use: Yes    Comment: rare   Drug use: No   Sexual activity: Not on file  Other Topics Concern   Not on file  Social History Narrative   Not on file   Social Determinants of Health   Financial Resource Strain: Not on file  Food Insecurity: Not on file  Transportation Needs: Not on file  Physical Activity: Not on file  Stress: Not on file  Social Connections: Not on file     Review of Systems: A 12 point ROS discussed and pertinent positives are indicated in the HPI above.  All other systems are negative.   Vital Signs: There were no vitals taken for this visit.  Physical Exam  Vitals and nursing note reviewed.  Constitutional:      General: He is not in acute distress.      Appearance: Normal appearance.  HENT:     Head: Normocephalic and atraumatic.     Mouth/Throat:     Mouth: Mucous membranes are moist.     Pharynx: Oropharynx is clear.  Cardiovascular:     Rate and Rhythm: Normal rate and regular rhythm.     Pulses: Normal pulses.     Heart sounds: Normal heart sounds.  Pulmonary:     Effort: Pulmonary effort is normal.     Breath sounds: Normal breath sounds. No wheezing, rhonchi or rales.  Abdominal:     General: Bowel sounds are normal. There is no distension.     Palpations: Abdomen is soft.  Skin:    General: Skin is warm and dry.  Neurological:     Mental Status: He is alert and oriented to person, place, and time.   Psychiatric:        Mood and Affect: Mood normal.        Behavior: Behavior normal.    MD Evaluation Airway: WNL Heart: WNL Abdomen: WNL Chest/ Lungs: WNL ASA  Classification: 3 Mallampati/Airway Score: Two  Imaging: No results found.  Labs:  CBC: Recent Labs    03/15/21 1336  WBC 6.4  HGB 12.2  HCT 37.0  PLT 306    COAGS: Recent Labs    03/20/21 1040  INR 1.0    BMP: Recent Labs    03/15/21 1336  NA 136  K 4.2  CL 104  CO2 26  GLUCOSE 99  BUN 18  CALCIUM 8.8*  CREATININE 0.71  GFRNONAA >60    LIVER FUNCTION TESTS: No results for input(s): BILITOT, AST, ALT, ALKPHOS, PROT, ALBUMIN in the last 8760 hours.  TUMOR MARKERS: No results for input(s): AFPTM, CEA, CA199, CHROMGRNA in the last 8760 hours.  Assessment and Plan: 75 y.o. female with right renal lesion who was referred to Dr. Lowella Dandy for possible ablation.  Patient had a consultation visit with Dr. Lowella Dandy on 01/03/2021, deemed to be a good candidate for biopsy and cryoablation of the right renal lesion.  After thorough discussion and shared decision making, patient decided to proceed with the biopsy and cryoablation.  Patient presents to [facility] IR today for the procedure. N.p.o. since midnight VS HTN with 160/81  INR 1.0  BMP on 03/15/2021 showed mild hypocalcemia 8.8, unremarkable otherwise CBC on 03/15/2021 all within normal limit Not on anticoagulation antiplatelet treatment  Risks and benefits of image guided renal cryoablation was discussed with the patient including, but not limited to, failure to treat entire lesion, bleeding, infection, damage to adjacent structures, hematuria, urine leak, decrease in renal function or post procedural neuropathy.  All of the patient's questions were answered and the patient is agreeable to proceed. Consent signed and in chart.   Thank you for this interesting consult.  I greatly enjoyed meeting Evangelynn Lochridge and look forward to participating in their  care.  A copy of this report was sent to the requesting provider on this date.  Electronically Signed: Willette Brace, PA-C 03/20/2021, 12:02 PM   I spent a total of    25 Minutes in face to face in clinical consultation, greater than 50% of which was counseling/coordinating care for biopsy and cryoablation of right renal lesion

## 2021-03-20 ENCOUNTER — Other Ambulatory Visit: Payer: Self-pay

## 2021-03-20 ENCOUNTER — Ambulatory Visit (HOSPITAL_COMMUNITY): Payer: Medicare Other | Admitting: Physician Assistant

## 2021-03-20 ENCOUNTER — Observation Stay (HOSPITAL_COMMUNITY)
Admission: RE | Admit: 2021-03-20 | Discharge: 2021-03-21 | Disposition: A | Discharge status: Home / Self Care | Payer: Medicare Other | Source: Ambulatory Visit | Attending: Diagnostic Radiology | Admitting: Diagnostic Radiology

## 2021-03-20 ENCOUNTER — Ambulatory Visit (HOSPITAL_COMMUNITY): Payer: Medicare Other | Admitting: Certified Registered"

## 2021-03-20 ENCOUNTER — Observation Stay (HOSPITAL_COMMUNITY)
Admission: RE | Admit: 2021-03-20 | Discharge: 2021-03-20 | Disposition: A | Discharge status: Home / Self Care | Payer: Medicare Other | Source: Ambulatory Visit | Attending: Diagnostic Radiology | Admitting: Diagnostic Radiology

## 2021-03-20 ENCOUNTER — Encounter (HOSPITAL_COMMUNITY): Payer: Self-pay

## 2021-03-20 ENCOUNTER — Encounter (HOSPITAL_COMMUNITY): Payer: Self-pay | Admitting: Diagnostic Radiology

## 2021-03-20 ENCOUNTER — Encounter (HOSPITAL_COMMUNITY)
Admission: RE | Disposition: A | Discharge status: Home / Self Care | Payer: Self-pay | Source: Ambulatory Visit | Attending: Diagnostic Radiology

## 2021-03-20 DIAGNOSIS — Z20822 Contact with and (suspected) exposure to covid-19: Secondary | ICD-10-CM | POA: Diagnosis not present

## 2021-03-20 DIAGNOSIS — Z87891 Personal history of nicotine dependence: Secondary | ICD-10-CM | POA: Diagnosis not present

## 2021-03-20 DIAGNOSIS — N289 Disorder of kidney and ureter, unspecified: Principal | ICD-10-CM | POA: Insufficient documentation

## 2021-03-20 DIAGNOSIS — N2889 Other specified disorders of kidney and ureter: Secondary | ICD-10-CM

## 2021-03-20 DIAGNOSIS — Z79899 Other long term (current) drug therapy: Secondary | ICD-10-CM | POA: Diagnosis not present

## 2021-03-20 DIAGNOSIS — R109 Unspecified abdominal pain: Secondary | ICD-10-CM | POA: Diagnosis not present

## 2021-03-20 HISTORY — PX: RADIOLOGY WITH ANESTHESIA: SHX6223

## 2021-03-20 LAB — TYPE AND SCREEN
ABO/RH(D): B POS
Antibody Screen: NEGATIVE

## 2021-03-20 LAB — PROTIME-INR
INR: 1 (ref 0.8–1.2)
Prothrombin Time: 13.4 seconds (ref 11.4–15.2)

## 2021-03-20 LAB — ABO/RH: ABO/RH(D): B POS

## 2021-03-20 SURGERY — IR WITH ANESTHESIA
Anesthesia: General

## 2021-03-20 MED ORDER — FENTANYL CITRATE (PF) 100 MCG/2ML IJ SOLN
INTRAMUSCULAR | Status: AC
  Administered 2021-03-20: 50 ug via INTRAVENOUS
  Filled 2021-03-20: qty 2

## 2021-03-20 MED ORDER — OXYCODONE HCL 5 MG/5ML PO SOLN: 5.0000 mg | Freq: Once | ORAL | Status: DC | PRN

## 2021-03-20 MED ORDER — DIPHENHYDRAMINE HCL 50 MG/ML IJ SOLN
INTRAMUSCULAR | Status: DC | PRN
  Administered 2021-03-20: 12.5 mg via INTRAVENOUS

## 2021-03-20 MED ORDER — PROMETHAZINE HCL 25 MG/ML IJ SOLN: 6.2500 mg | INTRAMUSCULAR | Status: DC | PRN

## 2021-03-20 MED ORDER — SODIUM CHLORIDE 0.9 % IV SOLN
2.0000 g | INTRAVENOUS | Status: AC
  Administered 2021-03-20: 2 g via INTRAVENOUS
  Filled 2021-03-20: qty 2

## 2021-03-20 MED ORDER — DOCUSATE SODIUM 100 MG PO CAPS
100.0000 mg | ORAL_CAPSULE | Freq: Two times a day (BID) | ORAL | Status: DC
  Administered 2021-03-20 – 2021-03-21 (×2): 100 mg via ORAL
  Filled 2021-03-20 (×2): qty 1

## 2021-03-20 MED ORDER — LACTATED RINGERS IV SOLN: INTRAVENOUS | Status: DC

## 2021-03-20 MED ORDER — FENTANYL CITRATE (PF) 100 MCG/2ML IJ SOLN
25.0000 ug | INTRAMUSCULAR | Status: DC | PRN
  Administered 2021-03-20: 50 ug via INTRAVENOUS

## 2021-03-20 MED ORDER — ONDANSETRON HCL 4 MG/2ML IJ SOLN: 4.0000 mg | Freq: Four times a day (QID) | INTRAMUSCULAR | Status: DC | PRN

## 2021-03-20 MED ORDER — PROPOFOL 10 MG/ML IV BOLUS
INTRAVENOUS | Status: DC | PRN
  Administered 2021-03-20: 150 mg via INTRAVENOUS

## 2021-03-20 MED ORDER — MIDAZOLAM HCL 2 MG/2ML IJ SOLN: 0.5000 mg | Freq: Once | INTRAMUSCULAR | Status: DC | PRN

## 2021-03-20 MED ORDER — SUGAMMADEX SODIUM 200 MG/2ML IV SOLN
INTRAVENOUS | Status: DC | PRN
  Administered 2021-03-20: 200 mg via INTRAVENOUS

## 2021-03-20 MED ORDER — ORAL CARE MOUTH RINSE: 15.0000 mL | Freq: Once | OROMUCOSAL | Status: AC

## 2021-03-20 MED ORDER — ONDANSETRON HCL 4 MG/2ML IJ SOLN
INTRAMUSCULAR | Status: DC | PRN
  Administered 2021-03-20: 4 mg via INTRAVENOUS

## 2021-03-20 MED ORDER — SERTRALINE HCL 50 MG PO TABS
50.0000 mg | ORAL_TABLET | Freq: Every day | ORAL | Status: DC
  Administered 2021-03-20 – 2021-03-21 (×2): 50 mg via ORAL
  Filled 2021-03-20 (×2): qty 1

## 2021-03-20 MED ORDER — FENTANYL CITRATE (PF) 250 MCG/5ML IJ SOLN
INTRAMUSCULAR | Status: AC
  Filled 2021-03-20: qty 5

## 2021-03-20 MED ORDER — DEXTROSE 5 % IV SOLN: INTRAVENOUS | Status: DC

## 2021-03-20 MED ORDER — SODIUM CHLORIDE 0.9 % IV SOLN
INTRAVENOUS | Status: AC
  Filled 2021-03-20: qty 250

## 2021-03-20 MED ORDER — MEPERIDINE HCL 50 MG/ML IJ SOLN: 6.2500 mg | INTRAMUSCULAR | Status: DC | PRN

## 2021-03-20 MED ORDER — HYDROCODONE-ACETAMINOPHEN 5-325 MG PO TABS
1.0000 | ORAL_TABLET | ORAL | Status: DC | PRN
  Administered 2021-03-20: 1 via ORAL
  Filled 2021-03-20 (×2): qty 1

## 2021-03-20 MED ORDER — ROCURONIUM BROMIDE 10 MG/ML (PF) SYRINGE
PREFILLED_SYRINGE | INTRAVENOUS | Status: DC | PRN
  Administered 2021-03-20: 60 mg via INTRAVENOUS
  Administered 2021-03-20: 40 mg via INTRAVENOUS

## 2021-03-20 MED ORDER — PHENYLEPHRINE 40 MCG/ML (10ML) SYRINGE FOR IV PUSH (FOR BLOOD PRESSURE SUPPORT)
PREFILLED_SYRINGE | INTRAVENOUS | Status: DC | PRN
  Administered 2021-03-20: 80 ug via INTRAVENOUS

## 2021-03-20 MED ORDER — OXYCODONE HCL 5 MG PO TABS: 5.0000 mg | ORAL_TABLET | Freq: Once | ORAL | Status: DC | PRN

## 2021-03-20 MED ORDER — SENNOSIDES-DOCUSATE SODIUM 8.6-50 MG PO TABS: 1.0000 | ORAL_TABLET | Freq: Every day | ORAL | Status: DC | PRN

## 2021-03-20 MED ORDER — LIDOCAINE 2% (20 MG/ML) 5 ML SYRINGE
INTRAMUSCULAR | Status: DC | PRN
  Administered 2021-03-20: 60 mg via INTRAVENOUS

## 2021-03-20 MED ORDER — CHLORHEXIDINE GLUCONATE 0.12 % MT SOLN
15.0000 mL | Freq: Once | OROMUCOSAL | Status: AC
  Administered 2021-03-20: 15 mL via OROMUCOSAL

## 2021-03-20 MED ORDER — FENTANYL CITRATE (PF) 100 MCG/2ML IJ SOLN
INTRAMUSCULAR | Status: DC | PRN
  Administered 2021-03-20 (×2): 50 ug via INTRAVENOUS

## 2021-03-20 MED ORDER — PHENYLEPHRINE HCL-NACL 10-0.9 MG/250ML-% IV SOLN
INTRAVENOUS | Status: DC | PRN
  Administered 2021-03-20: 40 ug/min via INTRAVENOUS

## 2021-03-20 MED ORDER — DEXAMETHASONE SODIUM PHOSPHATE 10 MG/ML IJ SOLN
INTRAMUSCULAR | Status: DC | PRN
  Administered 2021-03-20: 10 mg via INTRAVENOUS

## 2021-03-20 NOTE — Transfer of Care (Signed)
Immediate Anesthesia Transfer of Care Note  Patient: Carly [name]  Procedure(s) Performed: IR WITH ANESTHESIA CRYOABLATION  Patient Location: PACU  Anesthesia Type:General  Level of Consciousness: awake and patient cooperative  Airway & Oxygen Therapy: Patient Spontanous Breathing and Patient connected to face mask  Post-op Assessment: Report given to RN and Post -op Vital signs reviewed and stable  Post vital signs: Reviewed and stable  Last Vitals:  Vitals Value Taken Time  BP 163/73 03/20/21 1534  Temp    Pulse 59 03/20/21 1535  Resp 15 03/20/21 1535  SpO2 100 % 03/20/21 1535  Vitals shown include unvalidated device data.  Last Pain:  Vitals:   03/20/21 1015  TempSrc:   PainSc: 0-No pain      Patients Stated Pain Goal: 3 (03/20/21 1015)  Complications: No notable events documented.

## 2021-03-20 NOTE — Anesthesia Procedure Notes (Signed)
Procedure Name: Intubation Date/Time: 03/20/2021 12:40 PM Performed by: Cleda Daub, CRNA Pre-anesthesia Checklist: Patient identified, Emergency Drugs available, Suction available and Patient being monitored Patient Re-evaluated:Patient Re-evaluated prior to induction Oxygen Delivery Method: Circle system utilized Preoxygenation: Pre-oxygenation with 100% oxygen Induction Type: IV induction Ventilation: Mask ventilation without difficulty Laryngoscope Size: Mac and 3 Grade View: Grade I Tube type: Oral Tube size: 7.0 mm Number of attempts: 1 Airway Equipment and Method: Stylet Placement Confirmation: ETT inserted through vocal cords under direct vision, positive ETCO2 and breath sounds checked- equal and bilateral Secured at: 20 cm Tube secured with: Tape Dental Injury: Teeth and Oropharynx as per pre-operative assessment

## 2021-03-20 NOTE — Anesthesia Postprocedure Evaluation (Signed)
Anesthesia Post Note  Patient: Carly [name]  Procedure(s) Performed: IR WITH ANESTHESIA CRYOABLATION     Patient location during evaluation: PACU Anesthesia Type: General Level of consciousness: awake and alert, patient cooperative and oriented Pain management: pain level controlled Vital Signs Assessment: post-procedure vital signs reviewed and stable Respiratory status: spontaneous breathing, nonlabored ventilation and respiratory function stable Cardiovascular status: blood pressure returned to baseline and stable Postop Assessment: no apparent nausea or vomiting Anesthetic complications: no   No notable events documented.  Last Vitals:  Vitals:   03/20/21 1645 03/20/21 1724  BP: (!) 155/85 (!) 154/87  Pulse: (!) 51 (!) 55  Resp: 13 16  Temp: (!) 36.3 C (!) 36.3 C  SpO2: 95% 96%    Last Pain:  Vitals:   03/20/21 1724  TempSrc: Oral  PainSc:                  Carly [name],Carly [name]

## 2021-03-20 NOTE — Procedures (Signed)
Interventional Radiology Procedure:   Indications: Suspicious right renal lesion  Procedure: CT and US guided cryoablation of right renal lesion  Findings: Small exophytic lesion in right kidney.  2 IceRods placed in lesion and adequate ice ball was formed during cryoablation.  Small amount of perinephric bleeding noted during needle placements.  Perinephric hematoma appeared to be stable at end of procedure and patient was hemodynamically stable.  Aborted renal biopsy due to perinephric hematoma and difficulty seeing the lesion after the IceRods were in place.   Complications: None     EBL: less than 25 ml  Plan: Overnight observation.     Angelisse Riso R. Lowella Dandy, MD  Pager: [phone]

## 2021-03-20 NOTE — Anesthesia Preprocedure Evaluation (Addendum)
Anesthesia Evaluation  Patient identified by MRN, date of birth, ID band Patient awake    Reviewed: Allergy & Precautions, NPO status , Patient's Chart, lab work & pertinent test results  History of Anesthesia Complications (+) PONV  Airway Mallampati: II  TM Distance: >3 FB Neck ROM: Full    Dental  (+) Dental Advisory Given   Pulmonary former smoker,    breath sounds clear to auscultation       Cardiovascular negative cardio ROS   Rhythm:Regular Rate:Normal     Neuro/Psych Depression negative neurological ROS     GI/Hepatic negative GI ROS, Neg liver ROS,   Endo/Other  negative endocrine ROS  Renal/GU R renal mask     Musculoskeletal   Abdominal   Peds  Hematology negative hematology ROS (+)   Anesthesia Other Findings   Reproductive/Obstetrics                            Anesthesia Physical Anesthesia Plan  ASA: 2  Anesthesia Plan: General   Post-op Pain Management:    Induction: Intravenous  PONV Risk Score and Plan: 4 or greater and Dexamethasone and Ondansetron  Airway Management Planned: Oral ETT  Additional Equipment: None  Intra-op Plan:   Post-operative Plan: Extubation in OR  Informed Consent: I have reviewed the patients History and Physical, chart, labs and discussed the procedure including the risks, benefits and alternatives for the proposed anesthesia with the patient or authorized representative who has indicated his/her understanding and acceptance.     Dental advisory given  Plan Discussed with: CRNA and Surgeon  Anesthesia Plan Comments:        Anesthesia Quick Evaluation

## 2021-03-20 NOTE — Progress Notes (Signed)
Patient s/p right renal cryoablation and biopsy with Dr. Lowella Dandy today. Procedure was complicated by developing perinephric hematoma, hematoma was controlled after the procedure and patient was transferred to PACU in hemodynamically stable condition.  Biopsy was aborted due to perinephric hematoma and difficulty assessing the lesion after IceRods were in place.    Patient evaluated in PACU.  Patient laying in bed, not in acute distress. Pt appears tired, RN states that she just received some Fentanyl.  Patient shakes head for abdominal pain, shortness of breath, light headedness, chest palpitation.   VS mild hypothermia - on bear hugger, HTN 162/85 and bradycardia HR 54. O2 sat 98% RA.  CV RRR  Positive dressing on right flank puncture site. Site is unremarkable with no erythema, edema, tenderness, bleeding or drainage. Minimal amount of old, dry blood note son the dressing. Dressing otherwise clean, dry, and intact. Urine in foley bad slightly pink, no clots seen.   Pending COVID test result for admission.   Patient to be discharged tomorrow if remain stable.  Please call IR for questions and concerns.    Lynann Bologna Tonnia Bardin PA-C 03/20/2021 4:49 PM

## 2021-03-20 NOTE — Sedation Documentation (Signed)
Anesthesia in to sedate and monitor. 

## 2021-03-21 ENCOUNTER — Encounter (HOSPITAL_COMMUNITY): Payer: Self-pay | Admitting: Diagnostic Radiology

## 2021-03-21 DIAGNOSIS — N289 Disorder of kidney and ureter, unspecified: Secondary | ICD-10-CM | POA: Diagnosis not present

## 2021-03-21 LAB — SARS CORONAVIRUS 2 (TAT 6-24 HRS): SARS Coronavirus 2: NEGATIVE

## 2021-03-21 NOTE — Discharge Instructions (Signed)
Cryoablation of Renal Tumors, Care After  This sheet gives you information about how to care for yourself after your procedure. Your health care provider may also give you more specific instructions. If you have problems or questions, contact your health care provider.  What can I expect after the procedure? After your procedure, it is common to have pain and discomfort in the upper abdomen. You may be given prescription pain medicines to control this if the pain is severe.   Follow these instructions at home: Puncture site care Follow instructions from your health care provider about how to take care of your incision. Make sure you:  - Wash your hands with soap and water before you change your bandage (dressing). If soap and water are not available, use hand sanitizer.  - Ok to shower 48 hours following procedure. Recommend showering with bandage on, remove bandage immediately after showering and pat area dry. No further dressing changes needed after this- ensure area remains clean and dry until fully healed.  - No submerging (swimming, bathing) for 7 days post-procedure. Check your puncture site area every day for signs of infection. Check for: - Redness, swelling, or pain. - Fluid or blood. - Warmth. - Pus or a bad smell. Activity Rest as often as needing during the first few days of recovery. No stooping, bending, or lifting more than 10 pounds for 1 week.  General instructions To prevent or treat constipation while you are taking prescription pain medicine, your health care provider may recommend that you: - Drink enough fluid to keep your urine clear or pale yellow. - Take over-the-counter or prescription medicines. - Eat foods that are high in fiber, such as fresh fruits and vegetables, whole grains, and beans. - Limit foods that are high in fat and processed sugars, such as fried and sweet foods. - Do not use any products that contain nicotine or tobacco, such as cigarettes and  e-cigarettes. If you need help quitting, ask your health care provider. - Keep all follow-up visits as told by your health care provider. This is important.  3-4 week televisit with the doctor who performed the procedure. Our office will call you to set up the appointment.   Contact a health care provider if: You cannot pass gas. You are unable to have a bowel movement within 3 days. You have a skin rash.  Get help right away if: You have a fever. You have severe or lasting pain in your abdomen, shoulder, or back. You have trouble swallowing or breathing. You have severe weakness or dizziness. You have chest pain or shortness of breath.   This information is not intended to replace advice given to you by your health care provider. Make sure you discuss any questions you have with your health care provider. 

## 2021-03-21 NOTE — Discharge Summary (Signed)
Patient ID: Carly [name] MRN: [id number] DOB/AGE: February 28, 1946 75 y.o.  Admit date: 03/20/2021 Discharge date: 03/21/2021  Supervising Physician: Simonne Come  Patient Status: [facility] - In-pt  Admission Diagnoses: Right renal lesion  Discharge Diagnoses:  Active Problems:   Renal lesion   Discharged Condition: good  Hospital Course:   Carly [name] is a 75 y.o. female with past medical history POMP, depression, nephrolithiasis, and right renal lesion who was referred to Dr. Lowella Dandy for possible ablation of the right renal lesion.  Patient had a consultation visit with Dr. Lowella Dandy on 01/03/2021, deemed to be a appropriate candidate for right renal lesion cryoablation. Patient underwent successful cryoablation procedure yesterday under general anesthesia.  Unfortunately, the biopsy portion of the procedure was aborted after evidence of stable perinephric hematoma. She was admitted overnight for observation. This morning, patient is found resting comfortably in bedside chair.  She tolerated breakfast without issue.  Her foley has been removed with report of blood-tinged urine overnight.  She did have right flank pain overnight for which she did take pain medication, however she prefers to use Tylenol or ibuprofen as able at home and decline prescription for narcotic pain medication at home.  She is ambulating without difficulty and feels ready for discharge home today.  Patient aware schedulers will contact her with date and time of follow-up appointment.  She is stable for discharge home today.  Her daughter is present for assistance with care today.   Discharge Exam: Blood pressure (!) 107/57, pulse 63, temperature 98 F (36.7 C), temperature source Oral, resp. rate 14, height 5\' 4"  (1.626 m), weight 140 lb (63.5 kg), SpO2 94 %. General appearance: alert, cooperative, and no distress Resp: clear to auscultation bilaterally Cardio: regular rate and rhythm, S1, S2 normal, no murmur,  click, rub or gallop GI: normal findings: soft, non-tender Skin: Skin color, texture, turgor normal. No rashes or lesions Incision/Wound: Site clean and dry.  Dressing in place.  No erythema.  Mild-moderate tenderness in right lateral flank.  Disposition: Discharge disposition: 01-Home or Self Care       Discharge Instructions     Call MD for:  persistant nausea and vomiting   Complete by: As directed    Call MD for:  redness, tenderness, or signs of infection (pain, swelling, redness, odor or green/yellow discharge around incision site)   Complete by: As directed    Call MD for:  severe uncontrolled pain   Complete by: As directed    Call MD for:  temperature >100.4   Complete by: As directed    Diet - low sodium heart healthy   Complete by: As directed    Discharge instructions   Complete by: As directed    No heavy lifting (>10 lbs) for 1 week.   Keep procedure site clean and dry, replace bandage as needed.  Do not submerge in water for 3-5 days.  May take OTC Tylenol or ibuprofen (if ok with Kidney doctor) to help with back pain.  Schedulers will call to arrange follow up appointment.   Increase activity slowly   Complete by: As directed    Remove dressing in 24 hours   Complete by: As directed       Allergies as of 03/21/2021       Reactions   Acyclovir Swelling        Medication List     TAKE these medications    Fish Oil 1200 MG Caps Take 1,200 mg by mouth  daily.   HAIR/SKIN/NAILS PO Take 1 tablet by mouth daily.   OVER THE COUNTER MEDICATION Take 2-3 tablets by mouth daily. Nitric Oxide   sertraline 50 MG tablet Commonly known as: ZOLOFT Take 50 mg by mouth daily.   zolpidem 5 MG tablet Commonly known as: AMBIEN Take 5 mg by mouth at bedtime as needed for sleep.        Follow-up Information     Richarda Overlie, MD Follow up.   Specialties: Interventional Radiology, Radiology Why: Schedulers will call to arrange date and time of follow-up  appointment. Contact information: [address] [place] [address] [phone]                  Electronically Signed: Hoyt Koch, PA 03/21/2021, 12:07 PM   I have spent Greater Than 30 Minutes discharging [facility].

## 2021-04-16 NOTE — Interval H&P Note (Signed)
History and Physical Interval Note:  04/16/2021 10:30 AM  Carly [name]  has presented today for surgery, with the diagnosis of RIGHT RENAL MASS.  The various methods of treatment have been discussed with the patient and family. After consideration of risks, benefits and other options for treatment, the patient has consented to  Procedure(s): IR WITH ANESTHESIA CRYOABLATION (N/A) as a surgical intervention.  The patient's history has been reviewed, patient examined, no change in status, stable for surgery.  I have reviewed the patient's chart and labs.  Questions were answered to the patient's satisfaction.     Arn Medal

## 2021-04-17 ENCOUNTER — Ambulatory Visit
Admission: RE | Admit: 2021-04-17 | Discharge: 2021-04-17 | Disposition: A | Discharge status: Home / Self Care | Payer: Medicare Other | Source: Ambulatory Visit | Attending: Student | Admitting: Student

## 2021-04-17 ENCOUNTER — Other Ambulatory Visit: Payer: Self-pay

## 2021-04-17 DIAGNOSIS — N289 Disorder of kidney and ureter, unspecified: Secondary | ICD-10-CM

## 2021-04-17 HISTORY — PX: IR RADIOLOGIST EVAL & MGMT: IMG5224

## 2021-04-17 NOTE — Progress Notes (Signed)
Chief Complaint: Patient was consulted remotely today (TeleHealth) for right renal cryoablation follow-up.  Referring Physician(s): Marlou Porch MD  History of Present Illness: Carly [name] is a 75 y.o. female with history of a suspicious 1.4 cm lesion in the medial right kidney.  Patient underwent CT-guided cryoablation of the right renal lesion on 03/20/2021.  Due to the small size of the lesion, we were unable to perform a successful biopsy at the same time as the ablation.  Cryoablation appeared to be technically successful.  Patient developed a small perinephric hematoma during the procedure.  Following the procedure, the patient had right flank pain and it was difficult to take a deep breath.  Patient reports that when she went home her biggest problem was that she could not lay on her left side.  She says that she was significantly uncomfortable for approximately 10 days.  She denies hematuria, fevers or chills.  Currently she is asymptomatic.  She is getting back her strength and getting back to her regular routine.  Past Medical History:  Diagnosis Date   Depression    History of kidney stones    PONV (postoperative nausea and vomiting)     Past Surgical History:  Procedure Laterality Date   ABDOMINAL HYSTERECTOMY  2002   IR RADIOLOGIST EVAL & MGMT  01/03/2021   RADIOLOGY WITH ANESTHESIA N/A 03/20/2021   Procedure: IR WITH ANESTHESIA CRYOABLATION;  Surgeon: Richarda Overlie, MD;  Location: [facility];  Service: Anesthesiology;  Laterality: N/A;   SHOULDER ARTHROSCOPY Right 2008    Allergies: Acyclovir  Medications: Prior to Admission medications   Medication Sig Start Date End Date Taking? Authorizing Provider  Biotin w/ Vitamins C & E (HAIR/SKIN/NAILS PO) Take 1 tablet by mouth daily.    [provider]  Omega-3 Fatty Acids (FISH OIL) 1200 MG CAPS Take 1,200 mg by mouth daily.    [provider]  OVER THE COUNTER MEDICATION Take 2-3 tablets by mouth  daily. Nitric Oxide    [provider]  sertraline (ZOLOFT) 50 MG tablet Take 50 mg by mouth daily. 10/08/15   [provider]  zolpidem (AMBIEN) 5 MG tablet Take 5 mg by mouth at bedtime as needed for sleep.    [provider]     No family history on file.  Social History   Socioeconomic History   Marital status: Married    Spouse name: Not on file   Number of children: Not on file   Years of education: Not on file   Highest education level: Not on file  Occupational History   Not on file  Tobacco Use   Smoking status: Former    Years: 5.00    Types: Cigarettes    Quit date: 1970    Years since quitting: 52.6   Smokeless tobacco: Never  Vaping Use   Vaping Use: Never used  Substance and Sexual Activity   Alcohol use: Yes    Comment: rare   Drug use: No   Sexual activity: Not on file  Other Topics Concern   Not on file  Social History Narrative   Not on file   Social Determinants of Health   Financial Resource Strain: Not on file  Food Insecurity: Not on file  Transportation Needs: Not on file  Physical Activity: Not on file  Stress: Not on file  Social Connections: Not on file     Physical Exam No direct physical exam was performed   Vital Signs: There were  no vitals taken for this visit.  Imaging: CT GUIDE TISSUE ABLATION  Result Date: 03/20/2021 INDICATION: 75 year old with a suspicious right renal lesion. Patient presents for image guided cryoablation. EXAM: IMAGE GUIDED CRYOABLATION OF RIGHT RENAL LESION COMPARISON:  CT 12/11/2020 MEDICATIONS: Cefoxitin 2 g; The antibiotic was administered in an appropriate time interval prior to needle puncture of the skin. ANESTHESIA/SEDATION: General - as administered by the Anesthesia department FLUOROSCOPY TIME:  None COMPLICATIONS: None immediate. TECHNIQUE: Informed written consent was obtained from the patient after a thorough discussion of the procedural risks, benefits and alternatives.  All questions were addressed. A timeout was performed prior to the initiation of the procedure. Patient was intubated and placed prone on the CT scanner. The right kidney was identified with ultrasound. CT images through the abdomen were obtained. The right flank was prepped with chlorhexidine and sterile field was created. Maximal barrier sterile technique was utilized including caps, mask, sterile gowns, sterile gloves, sterile drape, hand hygiene and skin antiseptic. Using a combination of CT and ultrasound guidance, an IceRod cryoablation needle was directed into the medial right renal lesion. Needle placement was technically difficult due to renal lesion location and the lungs. Needle placement was confirmed within the lesion using CT and ultrasound guidance. Subsequently, a second IceRod needle was placed using CT and ultrasound guidance. At this point, there was a small amount of perinephric hematoma and it was difficult to evaluate the renal lesion with both CT or ultrasound. Attempted to place a 17 gauge coaxial needle for biopsy but this was technically difficult due to the perinephric hematoma. Therefore, renal biopsy was not performed. Cryoablation was performed with a 10 minute freeze, 8 minutes thaw, 10 minute freeze and final thaw. Cautery function was used in both needles prior to needle removal. Both needles removed without complication. Bandage placed over the puncture site. Patient was recovered in the PACU. FINDINGS: Small exophytic lesion along the medial right kidney upper pole. Lesion measures roughly 1.4 cm and similar to the prior imaging. Cryoablation needles were placed within the cranial and caudal aspect of the lesion using CT and ultrasound guidance. It was difficult to evaluate the lesions once the cryoablation needles were placed due to a small amount of perinephric hematoma. As result, a renal biopsy was not performed. Adequate ice ball formation during the cryoablation procedure.  Small perinephric hematoma appeared to be stable by the end of the procedure. Patient was hemodynamically stable. IMPRESSION: Successful image guided cryoablation of the right renal lesion. Renal biopsy was not performed as described. Electronically Signed   By: Richarda Overlie M.D.   On: 03/20/2021 17:31    Labs:  CBC: Recent Labs    03/15/21 1336  WBC 6.4  HGB 12.2  HCT 37.0  PLT 306    COAGS: Recent Labs    03/20/21 1040  INR 1.0    BMP: Recent Labs    03/15/21 1336  NA 136  K 4.2  CL 104  CO2 26  GLUCOSE 99  BUN 18  CALCIUM 8.8*  CREATININE 0.71  GFRNONAA >60    LIVER FUNCTION TESTS: No results for input(s): BILITOT, AST, ALT, ALKPHOS, PROT, ALBUMIN in the last 8760 hours.  TUMOR MARKERS: No results for input(s): AFPTM, CEA, CA199, CHROMGRNA in the last 8760 hours.  Assessment and Plan:  75 year old with history of a suspicious right renal lesion.  Lesion was treated with cryoablation on 03/20/2021.  A biopsy was unable to be performed at the same time as the ablation  procedure.  Lesion was compatible with a renal cell carcinoma based on prior imaging.  Unfortunately, the patient had a difficult recovery.  Initially she had significant right flank pain and difficulty breathing.  When she went home, she was having a difficult time sleeping on her left side.  At this time, the patient is asymptomatic.  She denies fevers, chills or hematuria.  Patient is returning to her normal activities.  Again, we presume the patient had a small renal cell carcinoma although we do not have a tissue diagnosis.  The lesion appeared to be adequately treated with cryoablation and we will plan for routine follow-up.  Plan for a follow-up patient visit and MRI of the abdomen, with and without contrast, in 6 months.    Electronically Signed: Arn Medal 04/17/2021, 1:55 PM   I spent a total of    5 Minutes in remote  clinical consultation, greater than 50% of which was  counseling/coordinating care for right renal cryoablation.    Visit type: Audio only (telephone). Audio (no video) only due to patient preference. Alternative for in-person consultation at [facility], [address], [place], [place]. This visit type was conducted due to national recommendations for restrictions regarding the COVID-19 Pandemic (e.g. social distancing).  This format is felt to be most appropriate for this patient at this time.  All issues noted in this document were discussed and addressed.   Patient ID: Carly [name], female   DOB: 1946/06/14, 75 y.o.   MRN: [id number]

## 2021-08-07 ENCOUNTER — Ambulatory Visit: Payer: Medicare Other | Admitting: Podiatry

## 2021-08-07 ENCOUNTER — Other Ambulatory Visit: Payer: Self-pay

## 2021-08-07 DIAGNOSIS — L989 Disorder of the skin and subcutaneous tissue, unspecified: Secondary | ICD-10-CM | POA: Diagnosis not present

## 2021-08-07 NOTE — Progress Notes (Signed)
   Subjective: 75 y.o. female presenting to the office today as a reestablish new patient for evaluation of symptomatic calluses that is developed to the plantar aspect of the forefoot bilateral.  Patient states that they are very tender to walk on.  She presents for further treatment and evaluation   Past Medical History:  Diagnosis Date   Depression    History of kidney stones    PONV (postoperative nausea and vomiting)      Objective:  Physical Exam General: Alert and oriented x3 in no acute distress  Dermatology: Hyperkeratotic lesion(s) present on the plantar aspect of the forefoot bilateral. Pain on palpation with a central nucleated core noted. Skin is warm, dry and supple bilateral lower extremities. Negative for open lesions or macerations.  Vascular: Palpable pedal pulses bilaterally. No edema or erythema noted. Capillary refill within normal limits.  Neurological: Epicritic and protective threshold grossly intact bilaterally.   Musculoskeletal Exam: Pain on palpation at the keratotic lesion(s) noted. Range of motion within normal limits bilateral. Muscle strength 5/5 in all groups bilateral.  Assessment: 1.  Symptomatic calluses bilateral forefoot plantar MTP joints   Plan of Care:  1. Patient evaluated 2. Excisional debridement of keratoic lesion(s) using a chisel blade was performed without incident.  3. Dressed area with light dressing. 4. Patient is to return to the clinic PRN.   Felecia Shelling, DPM [facility]  Dr. Felecia Shelling, DPM    [address] [place], [place] [address]                Office [phone]  Fax [phone]

## 2021-09-21 ENCOUNTER — Other Ambulatory Visit: Payer: Self-pay

## 2021-09-21 ENCOUNTER — Emergency Department (HOSPITAL_BASED_OUTPATIENT_CLINIC_OR_DEPARTMENT_OTHER): Payer: Medicare Other

## 2021-09-21 ENCOUNTER — Encounter (HOSPITAL_BASED_OUTPATIENT_CLINIC_OR_DEPARTMENT_OTHER): Payer: Self-pay

## 2021-09-21 ENCOUNTER — Emergency Department (HOSPITAL_BASED_OUTPATIENT_CLINIC_OR_DEPARTMENT_OTHER)
Admission: EM | Admit: 2021-09-21 | Discharge: 2021-09-21 | Disposition: A | Discharge status: Home / Self Care | Payer: Medicare Other | Attending: Emergency Medicine | Admitting: Emergency Medicine

## 2021-09-21 DIAGNOSIS — N132 Hydronephrosis with renal and ureteral calculous obstruction: Secondary | ICD-10-CM | POA: Diagnosis not present

## 2021-09-21 DIAGNOSIS — R109 Unspecified abdominal pain: Secondary | ICD-10-CM | POA: Diagnosis present

## 2021-09-21 DIAGNOSIS — N3001 Acute cystitis with hematuria: Secondary | ICD-10-CM | POA: Diagnosis not present

## 2021-09-21 DIAGNOSIS — N200 Calculus of kidney: Secondary | ICD-10-CM

## 2021-09-21 LAB — URINALYSIS, ROUTINE W REFLEX MICROSCOPIC
Bilirubin Urine: NEGATIVE
Glucose, UA: NEGATIVE mg/dL
Ketones, ur: NEGATIVE mg/dL
Nitrite: NEGATIVE
Protein, ur: 30 mg/dL — AB
Specific Gravity, Urine: 1.025 (ref 1.005–1.030)
pH: 6.5 (ref 5.0–8.0)

## 2021-09-21 LAB — COMPREHENSIVE METABOLIC PANEL
ALT: 14 U/L (ref 0–44)
AST: 22 U/L (ref 15–41)
Albumin: 3.3 g/dL — ABNORMAL LOW (ref 3.5–5.0)
Alkaline Phosphatase: 41 U/L (ref 38–126)
Anion gap: 7 (ref 5–15)
BUN: 16 mg/dL (ref 8–23)
CO2: 24 mmol/L (ref 22–32)
Calcium: 7.8 mg/dL — ABNORMAL LOW (ref 8.9–10.3)
Chloride: 104 mmol/L (ref 98–111)
Creatinine, Ser: 0.71 mg/dL (ref 0.44–1.00)
GFR, Estimated: >60 mL/min (ref >60)
Glucose, Bld: 127 mg/dL — ABNORMAL HIGH (ref 70–99)
Potassium: 3.5 mmol/L (ref 3.5–5.1)
Sodium: 135 mmol/L (ref 135–145)
Total Bilirubin: 1.4 mg/dL — ABNORMAL HIGH (ref 0.3–1.2)
Total Protein: 6.7 g/dL (ref 6.5–8.1)

## 2021-09-21 LAB — URINALYSIS, MICROSCOPIC (REFLEX)

## 2021-09-21 LAB — CBC
HCT: 33.8 % — ABNORMAL LOW (ref 36.0–46.0)
Hemoglobin: 11.4 g/dL — ABNORMAL LOW (ref 12.0–15.0)
MCH: 30.6 pg (ref 26.0–34.0)
MCHC: 33.7 g/dL (ref 30.0–36.0)
MCV: 90.6 fL (ref 80.0–100.0)
Platelets: 255 10*3/uL (ref 150–400)
RBC: 3.73 MIL/uL — ABNORMAL LOW (ref 3.87–5.11)
RDW: 13.7 % (ref 11.5–15.5)
WBC: 7.3 10*3/uL (ref 4.0–10.5)
nRBC: 0 % (ref 0.0–0.2)

## 2021-09-21 LAB — LIPASE, BLOOD: Lipase: 26 U/L (ref 11–51)

## 2021-09-21 MED ORDER — TAMSULOSIN HCL 0.4 MG PO CAPS: 0.4000 mg | ORAL_CAPSULE | Freq: Every day | ORAL | 0 refills | Status: AC

## 2021-09-21 MED ORDER — CEPHALEXIN 500 MG PO CAPS: 500.0000 mg | ORAL_CAPSULE | Freq: Three times a day (TID) | ORAL | 0 refills | Status: AC

## 2021-09-21 MED ORDER — ONDANSETRON HCL 4 MG/2ML IJ SOLN
4.0000 mg | Freq: Once | INTRAMUSCULAR | Status: AC
  Administered 2021-09-21: 4 mg via INTRAVENOUS
  Filled 2021-09-21: qty 2

## 2021-09-21 MED ORDER — HYDROCODONE-ACETAMINOPHEN 5-325 MG PO TABS: 1.0000 | ORAL_TABLET | Freq: Four times a day (QID) | ORAL | 0 refills | Status: AC | PRN

## 2021-09-21 MED ORDER — TAMSULOSIN HCL 0.4 MG PO CAPS
0.4000 mg | ORAL_CAPSULE | Freq: Every day | ORAL | Status: DC
  Administered 2021-09-21: 0.4 mg via ORAL
  Filled 2021-09-21: qty 1

## 2021-09-21 MED ORDER — KETOROLAC TROMETHAMINE 15 MG/ML IJ SOLN
15.0000 mg | Freq: Once | INTRAMUSCULAR | Status: AC
  Administered 2021-09-21: 15 mg via INTRAVENOUS
  Filled 2021-09-21: qty 1

## 2021-09-21 NOTE — ED Provider Notes (Signed)
[facility] EMERGENCY DEPARTMENT Provider Note   CSN: [id number] Arrival date & time: 09/21/21  [place]     History  Chief Complaint  Patient presents with   Fatigue   Flank Pain   Abdominal Pain    Carly [name] is a 76 y.o. female.  Patient is a 76 yo female presenting for flank pain. Pt admits to right sided abdominal pain with radiating to right flank, acute in onset, started at rest, colicky in nature. Admits to increased urinary frequency without dysuria. Denies hematuria. Known renal stones on left side.   The history is provided by the patient. No language interpreter was used.  Flank Pain Associated symptoms include abdominal pain. Pertinent negatives include no chest pain and no shortness of breath.  Abdominal Pain Associated symptoms: nausea   Associated symptoms: no chest pain, no chills, no cough, no dysuria, no fever, no hematuria, no shortness of breath, no sore throat and no vomiting       Home Medications Prior to Admission medications   Medication Sig Start Date End Date Taking? Authorizing Provider  amLODipine (NORVASC) 2.5 MG tablet Take 2.5 mg by mouth daily. 06/26/21   [provider]  Biotin w/ Vitamins C & E (HAIR/SKIN/NAILS PO) Take 1 tablet by mouth daily.    [provider]  Omega-3 Fatty Acids (FISH OIL) 1200 MG CAPS Take 1,200 mg by mouth daily.    [provider]  OVER THE COUNTER MEDICATION Take 2-3 tablets by mouth daily. Nitric Oxide    [provider]  sertraline (ZOLOFT) 50 MG tablet Take 50 mg by mouth daily. 10/08/15   [provider]  zolpidem (AMBIEN) 5 MG tablet Take 5 mg by mouth at bedtime as needed for sleep.    [provider]      Allergies    Acyclovir    Review of Systems   Review of Systems  Constitutional:  Negative for chills and fever.  HENT:  Negative for ear pain and sore throat.   Eyes:  Negative for pain and visual disturbance.  Respiratory:   Negative for cough and shortness of breath.   Cardiovascular:  Negative for chest pain and palpitations.  Gastrointestinal:  Positive for abdominal pain and nausea. Negative for vomiting.  Genitourinary:  Positive for flank pain and frequency. Negative for dysuria and hematuria.  Musculoskeletal:  Negative for arthralgias and back pain.  Skin:  Negative for color change and rash.  Neurological:  Negative for seizures and syncope.  All other systems reviewed and are negative.  Physical Exam Updated Vital Signs BP (!) 146/71    Pulse 82    Temp 98.4 F (36.9 C) (Oral)    Resp 17    Ht 5\' 5"  (1.651 m)    Wt 64.4 kg    SpO2 96%    BMI 23.63 kg/m  Physical Exam Vitals and nursing note reviewed.  Constitutional:      General: She is not in acute distress.    Appearance: She is well-developed.  HENT:     Head: Normocephalic and atraumatic.  Eyes:     Conjunctiva/sclera: Conjunctivae normal.  Cardiovascular:     Rate and Rhythm: Normal rate and regular rhythm.     Heart sounds: No murmur heard. Pulmonary:     Effort: Pulmonary effort is normal. No respiratory distress.     Breath sounds: Normal breath sounds.  Abdominal:     Palpations: Abdomen is soft.     Tenderness: There  is no abdominal tenderness.  Musculoskeletal:        General: No swelling.     Cervical back: Neck supple.  Skin:    General: Skin is warm and dry.     Capillary Refill: Capillary refill takes less than 2 seconds.  Neurological:     Mental Status: She is alert.  Psychiatric:        Mood and Affect: Mood normal.    ED Results / Procedures / Treatments   Labs (all labs ordered are listed, but only abnormal results are displayed) Labs Reviewed  CBC - Abnormal; Notable for the following components:      Result Value   RBC 3.73 (*)    Hemoglobin 11.4 (*)    HCT 33.8 (*)    All other components within normal limits  LIPASE, BLOOD  COMPREHENSIVE METABOLIC PANEL  URINALYSIS, ROUTINE W REFLEX MICROSCOPIC     EKG None  Radiology No results found.  Procedures Procedures    Medications Ordered in ED Medications  ondansetron (ZOFRAN) injection 4 mg (has no administration in time range)  ketorolac (TORADOL) 15 MG/ML injection 15 mg (has no administration in time range)    ED Course/ Medical Decision Making/ A&P                           Medical Decision Making Amount and/or Complexity of Data Reviewed Labs: ordered. Radiology: ordered.  Risk Prescription drug management.   7:46 PM Pt is a 76 yo female presenting for acute onset, colicky, right sided abdominal pain with radiation to right flank. Pt is Aox3, no acute distress, afebrile, with stable vitals. Abdomen is soft and nontender. No s/s sepsis.  Toradol and zofran given.   CT renal stone study demonstrates a 5 mm distal right ureteral stone with mild right hydronephrosis. UA demonstrates very mild UTI. Low suspicion for septic stone. Will cover with antibiotics and recommend close f/u with urology with strict return precautions if fever develops. Stable renal function. Pt discharged with medication for pain control and flomax.   Patient in no distress and overall condition improved here in the ED. Detailed discussions were had with the patient regarding current findings, and need for close f/u with PCP or on call doctor. The patient has been instructed to return immediately if the symptoms worsen in any way for re-evaluation. Patient verbalized understanding and is in agreement with current care plan. All questions answered prior to discharge.           Final Clinical Impression(s) / ED Diagnoses Final diagnoses:  Nephrolithiasis  Acute cystitis with hematuria    Rx / DC Orders ED Discharge Orders     None         Lianne Cure, DO [id number]

## 2021-09-21 NOTE — ED Notes (Signed)
Patient and son updated on plan of care.  Patient reports improvement in pain and nausea.

## 2021-09-21 NOTE — ED Triage Notes (Signed)
Pt c/o right sided flank/abdominal pain, nausea, and fatigue since yesterday. Pt reports she has both a known kidney stone and gallstone so she isn't sure which could be causing the symptoms.

## 2021-09-25 ENCOUNTER — Emergency Department (HOSPITAL_BASED_OUTPATIENT_CLINIC_OR_DEPARTMENT_OTHER)
Admission: EM | Admit: 2021-09-25 | Discharge: 2021-09-25 | Disposition: A | Discharge status: Home / Self Care | Payer: Medicare Other | Attending: Emergency Medicine | Admitting: Emergency Medicine

## 2021-09-25 ENCOUNTER — Encounter (HOSPITAL_BASED_OUTPATIENT_CLINIC_OR_DEPARTMENT_OTHER): Payer: Self-pay

## 2021-09-25 ENCOUNTER — Other Ambulatory Visit: Payer: Self-pay

## 2021-09-25 DIAGNOSIS — T6591XA Toxic effect of unspecified substance, accidental (unintentional), initial encounter: Secondary | ICD-10-CM | POA: Diagnosis not present

## 2021-09-25 DIAGNOSIS — Z79899 Other long term (current) drug therapy: Secondary | ICD-10-CM | POA: Insufficient documentation

## 2021-09-25 DIAGNOSIS — R197 Diarrhea, unspecified: Secondary | ICD-10-CM | POA: Diagnosis present

## 2021-09-25 DIAGNOSIS — T3695XA Adverse effect of unspecified systemic antibiotic, initial encounter: Secondary | ICD-10-CM

## 2021-09-25 DIAGNOSIS — K521 Toxic gastroenteritis and colitis: Secondary | ICD-10-CM

## 2021-09-25 LAB — COMPREHENSIVE METABOLIC PANEL
ALT: 57 U/L — ABNORMAL HIGH (ref 0–44)
AST: 67 U/L — ABNORMAL HIGH (ref 15–41)
Albumin: 3.4 g/dL — ABNORMAL LOW (ref 3.5–5.0)
Alkaline Phosphatase: 70 U/L (ref 38–126)
Anion gap: 9 (ref 5–15)
BUN: 14 mg/dL (ref 8–23)
CO2: 26 mmol/L (ref 22–32)
Calcium: 8.1 mg/dL — ABNORMAL LOW (ref 8.9–10.3)
Chloride: 100 mmol/L (ref 98–111)
Creatinine, Ser: 0.84 mg/dL (ref 0.44–1.00)
GFR, Estimated: >60 mL/min (ref >60)
Glucose, Bld: 105 mg/dL — ABNORMAL HIGH (ref 70–99)
Potassium: 3.5 mmol/L (ref 3.5–5.1)
Sodium: 135 mmol/L (ref 135–145)
Total Bilirubin: 1.2 mg/dL (ref 0.3–1.2)
Total Protein: 7 g/dL (ref 6.5–8.1)

## 2021-09-25 LAB — CBC WITH DIFFERENTIAL/PLATELET
Abs Immature Granulocytes: 0.04 10*3/uL (ref 0.00–0.07)
Basophils Absolute: 0 10*3/uL (ref 0.0–0.1)
Basophils Relative: 1 %
Eosinophils Absolute: 0.2 10*3/uL (ref 0.0–0.5)
Eosinophils Relative: 2 %
HCT: 34.9 % — ABNORMAL LOW (ref 36.0–46.0)
Hemoglobin: 11.7 g/dL — ABNORMAL LOW (ref 12.0–15.0)
Immature Granulocytes: 1 %
Lymphocytes Relative: 13 %
Lymphs Abs: 1.2 10*3/uL (ref 0.7–4.0)
MCH: 30 pg (ref 26.0–34.0)
MCHC: 33.5 g/dL (ref 30.0–36.0)
MCV: 89.5 fL (ref 80.0–100.0)
Monocytes Absolute: 0.8 10*3/uL (ref 0.1–1.0)
Monocytes Relative: 9 %
Neutro Abs: 6.4 10*3/uL (ref 1.7–7.7)
Neutrophils Relative %: 74 %
Platelets: 289 10*3/uL (ref 150–400)
RBC: 3.9 MIL/uL (ref 3.87–5.11)
RDW: 13.5 % (ref 11.5–15.5)
WBC: 8.6 10*3/uL (ref 4.0–10.5)
nRBC: 0 % (ref 0.0–0.2)

## 2021-09-25 LAB — LIPASE, BLOOD: Lipase: 25 U/L (ref 11–51)

## 2021-09-25 LAB — OCCULT BLOOD X 1 CARD TO LAB, STOOL: Fecal Occult Bld: POSITIVE — AB

## 2021-09-25 MED ORDER — ACETAMINOPHEN 500 MG PO TABS
1000.0000 mg | ORAL_TABLET | Freq: Once | ORAL | Status: AC
  Administered 2021-09-25: 14:00:00 1000 mg via ORAL
  Filled 2021-09-25: qty 2

## 2021-09-25 MED ORDER — LOPERAMIDE HCL 2 MG PO CAPS
2.0000 mg | ORAL_CAPSULE | ORAL | Status: DC | PRN
  Administered 2021-09-25: 14:00:00 2 mg via ORAL
  Filled 2021-09-25: qty 1

## 2021-09-25 NOTE — ED Provider Notes (Signed)
[facility] EMERGENCY DEPARTMENT Provider Note   CSN: [id number] Arrival date & time: 09/25/21  1346     History  Chief Complaint  Patient presents with   Rectal Bleeding    Carly [name] is a 76 y.o. female.  76 yo F with a chief complaints of dark stool.'s been going on since yesterday evening.  She had developed diarrhea as she thought secondary to her antibiotic use.  She was recently seen in this emergency department with nephrolithiasis and was started on antibiotics.  She has seen her urologist since then as well as her PCP.  Had her antibiotics changed.  Was taken off of Flomax.  She feels like that pain persists but is not nearly as severe as when she presented initially.  She is more concerned about the blood in her stool.  She read a pamphlet on the antibiotics that she was given is concerned that she has C. difficile.  The history is provided by the patient.  Rectal Bleeding     Home Medications Prior to Admission medications   Medication Sig Start Date End Date Taking? Authorizing Provider  amLODipine (NORVASC) 2.5 MG tablet Take 2.5 mg by mouth daily. 06/26/21   [provider]  Biotin w/ Vitamins C & E (HAIR/SKIN/NAILS PO) Take 1 tablet by mouth daily.    [provider]  cephALEXin (KEFLEX) 500 MG capsule Take 1 capsule (500 mg total) by mouth 3 (three) times daily for 7 days. 09/21/21 09/28/21  Franne FortsGray, Alicia P, DO  HYDROcodone-acetaminophen (NORCO) 5-325 MG tablet Take 1 tablet by mouth every 6 (six) hours as needed for severe pain. 09/21/21   Franne FortsGray, Alicia P, DO  Omega-3 Fatty Acids (FISH OIL) 1200 MG CAPS Take 1,200 mg by mouth daily.    [provider]  OVER THE COUNTER MEDICATION Take 2-3 tablets by mouth daily. Nitric Oxide    [provider]  sertraline (ZOLOFT) 50 MG tablet Take 50 mg by mouth daily. 10/08/15   [provider]  tamsulosin (FLOMAX) 0.4 MG CAPS capsule Take 1 capsule (0.4 mg total) by  mouth daily for 14 days. 09/21/21 10/05/21  Edwin DadaGray, Alicia P, DO  zolpidem (AMBIEN) 5 MG tablet Take 5 mg by mouth at bedtime as needed for sleep.    [provider]      Allergies    Acyclovir and Keflex [cephalexin]    Review of Systems   Review of Systems  Gastrointestinal:  Positive for hematochezia.   Physical Exam Updated Vital Signs BP 122/62    Pulse 63    Temp 98.4 F (36.9 C) (Oral)    Resp 16    SpO2 96%  Physical Exam Vitals and nursing note reviewed.  Constitutional:      General: She is not in acute distress.    Appearance: She is well-developed. She is not diaphoretic.  HENT:     Head: Normocephalic and atraumatic.  Eyes:     Pupils: Pupils are equal, round, and reactive to light.  Cardiovascular:     Rate and Rhythm: Normal rate and regular rhythm.     Heart sounds: No murmur heard.   No friction rub. No gallop.  Pulmonary:     Effort: Pulmonary effort is normal.     Breath sounds: No wheezing or rales.  Abdominal:     General: There is no distension.     Palpations: Abdomen is soft.     Tenderness: There is no abdominal tenderness.  Musculoskeletal:  General: No tenderness.     Cervical back: Normal range of motion and neck supple.  Skin:    General: Skin is warm and dry.  Neurological:     Mental Status: She is alert and oriented to person, place, and time.  Psychiatric:        Behavior: Behavior normal.    ED Results / Procedures / Treatments   Labs (all labs ordered are listed, but only abnormal results are displayed) Labs Reviewed  CBC WITH DIFFERENTIAL/PLATELET - Abnormal; Notable for the following components:      Result Value   Hemoglobin 11.7 (*)    HCT 34.9 (*)    All other components within normal limits  COMPREHENSIVE METABOLIC PANEL - Abnormal; Notable for the following components:   Glucose, Bld 105 (*)    Calcium 8.1 (*)    Albumin 3.4 (*)    AST 67 (*)    ALT 57 (*)    All other components within normal limits   OCCULT BLOOD X 1 CARD TO LAB, STOOL - Abnormal; Notable for the following components:   Fecal Occult Bld POSITIVE (*)    All other components within normal limits  LIPASE, BLOOD    EKG None  Radiology No results found.  Procedures Procedures    Medications Ordered in ED Medications  acetaminophen (TYLENOL) tablet 1,000 mg (1,000 mg Oral Given 09/25/21 1420)    ED Course/ Medical Decision Making/ A&P                           Medical Decision Making Amount and/or Complexity of Data Reviewed Labs: ordered.  Risk OTC drugs. Prescription drug management.   76 yo F with a chief complaints of bloody stool.  Patient was able to provide a stool sample immediately upon arrival that has not consistent with GI bleeding per my initial view.  She was concerned about C. difficile but has formed stool here.  She is well-appearing and nontoxic.  We will repeat labs that she was just here to assess for electrolyte abnormality with diarrhea and vomiting as well as acute anemia.  Will Hemoccult for stool sample.  Her medical records reviewed and she is not on blood thinning medications.  She has increased her NSAID intake over the past few days with her kidney stone.  FOBT +, however hgb higher today than a few days ago.  Stool on exam not consistent with acute GI bleeding.  Will d/c home.  PCP follow up.   7:12 AM:  I have discussed the diagnosis/risks/treatment options with the patient and believe the pt to be eligible for discharge home to follow-up with PCP, GI. We also discussed returning to the ED immediately if new or worsening sx occur. We discussed the sx which are most concerning (e.g., sudden worsening pain, fever, inability to tolerate by mouth, worsening bleeding, syncope, near syncope) that necessitate immediate return. Medications administered to the patient during their visit and any new prescriptions provided to the patient are listed below.  Medications given during this  visit Medications  acetaminophen (TYLENOL) tablet 1,000 mg (1,000 mg Oral Given 09/25/21 1420)     The patient appears reasonably screen and/or stabilized for discharge and I doubt any other medical condition or other [facility] requiring further screening, evaluation, or treatment in the ED at this time prior to discharge.          Final Clinical Impression(s) / ED Diagnoses Final diagnoses:  Antibiotic-associated  diarrhea    Rx / DC Orders ED Discharge Orders     None         Melene Plan, [place] 09/28/21 6010

## 2021-09-25 NOTE — ED Notes (Signed)
Pt ambulatory from triage, no assistance needed.  

## 2021-09-25 NOTE — Discharge Instructions (Signed)
Return for worsening pain, fever, feeling like you may pass out.  Return for worsening stool output.  Follow up with your urologist.  You can try imodium for diarrhea.

## 2021-09-25 NOTE — ED Notes (Signed)
D/c paperwork reviewed with pt, including f/u care. Pt with no questions or concerns at time of d/c. Ambulatory to ED exit.  

## 2021-09-25 NOTE — ED Triage Notes (Addendum)
Pt states she has had blood in her stool. Has been having diarrhea, recent abx use. Vomiting last night. Recently dx with kidney stone and UTI

## 2021-10-02 ENCOUNTER — Other Ambulatory Visit: Payer: Self-pay | Admitting: Diagnostic Radiology

## 2021-10-02 DIAGNOSIS — N289 Disorder of kidney and ureter, unspecified: Secondary | ICD-10-CM

## 2022-02-10 IMAGING — CT CT RENAL STONE PROTOCOL
2 of 3 series · 16 of 46 positions shown, 18 images · non-contrast
Comparison: CT abdomen pelvis dated 06/19/2021.

CLINICAL DATA: Flank pain.  Concern for kidney stone.



[Series 2: axial st · axial · 0.91mm/px · z∈[+586,+946]mm · 13 of 84 slices shown, 15 images]
[im 6/84  soft-tissue]
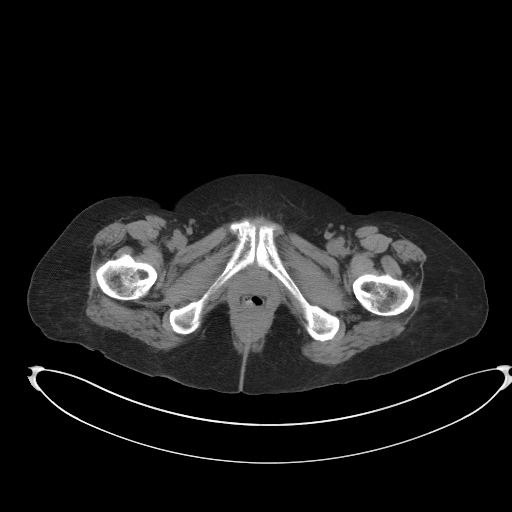
[im 6/84  bone]
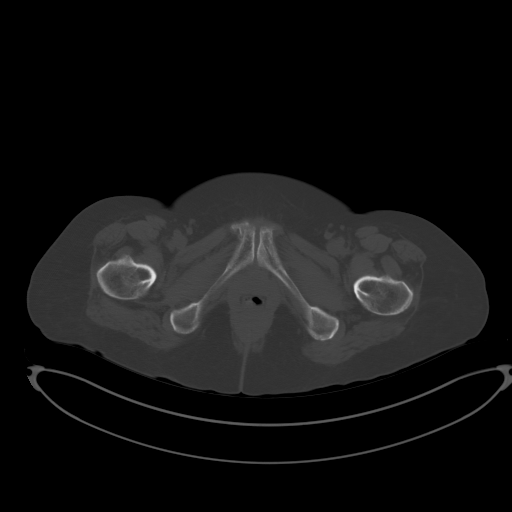
[im 11/84  soft-tissue]
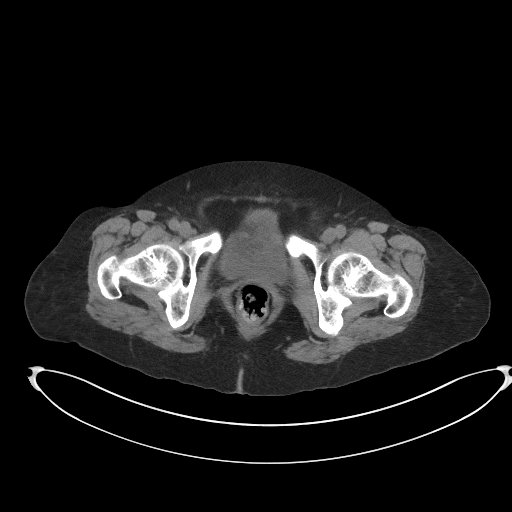
[im 17/84  soft-tissue]
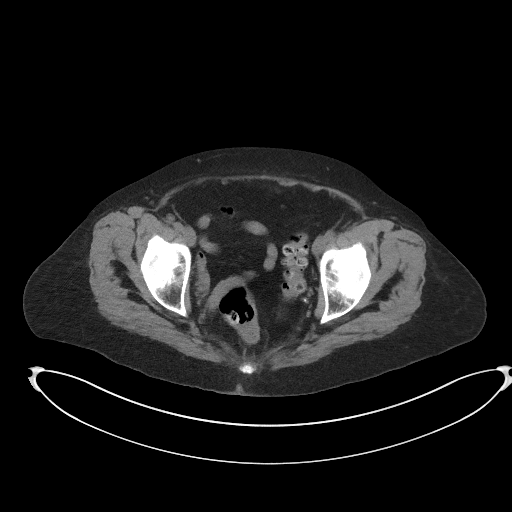
[im 25/84  soft-tissue]
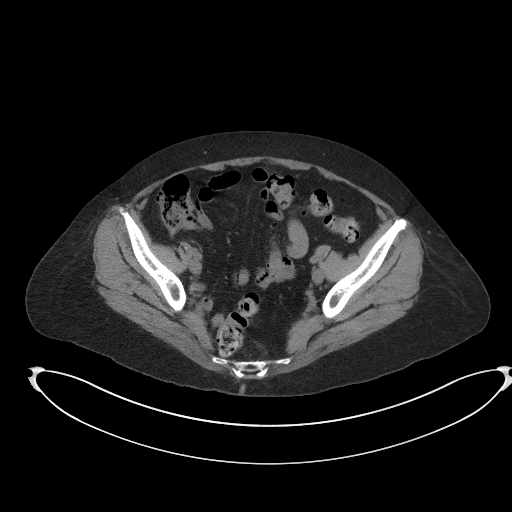
[im 30/84  soft-tissue]
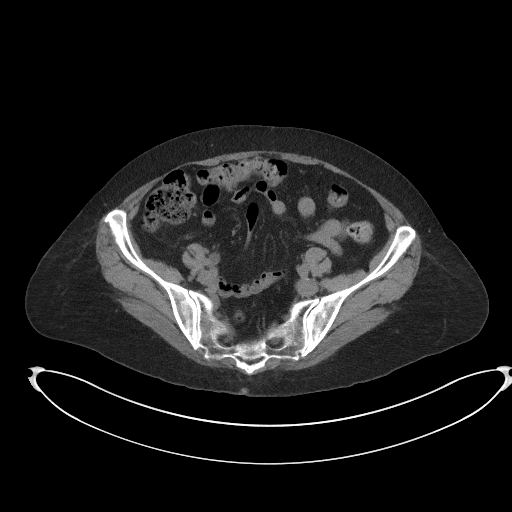
[im 35/84  soft-tissue]
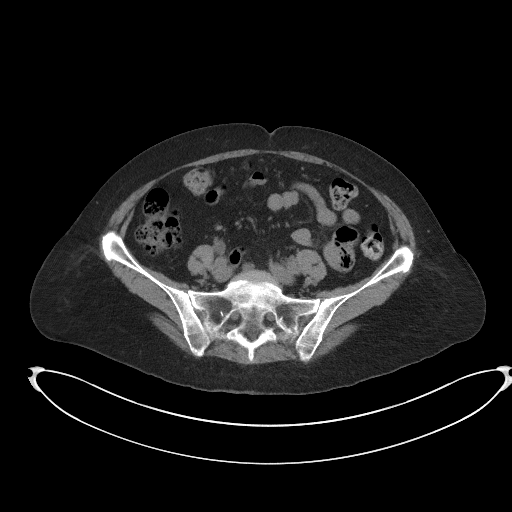
[im 43/84  soft-tissue]
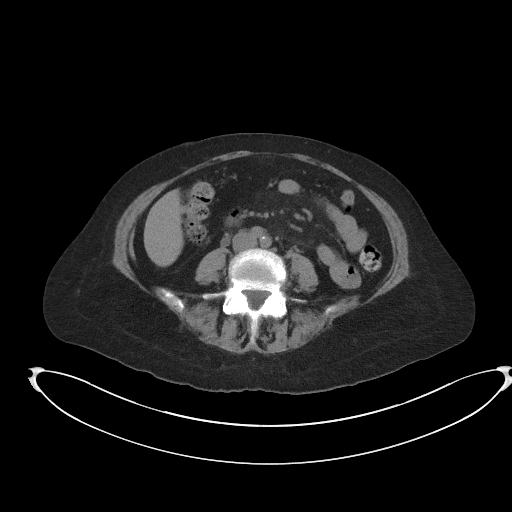
[im 49/84  soft-tissue]
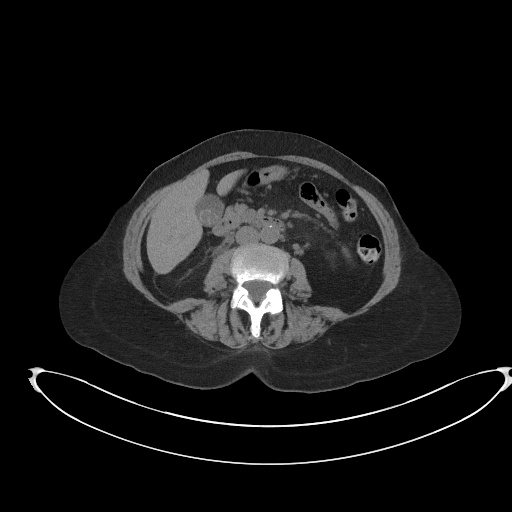
[im 54/84  soft-tissue]
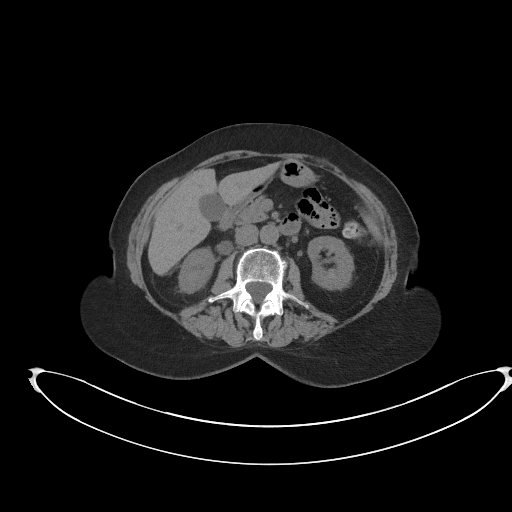
[im 54/84  bone]
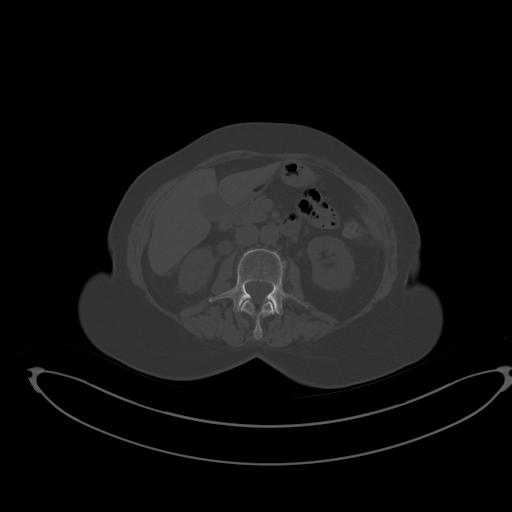
[im 59/84  soft-tissue]
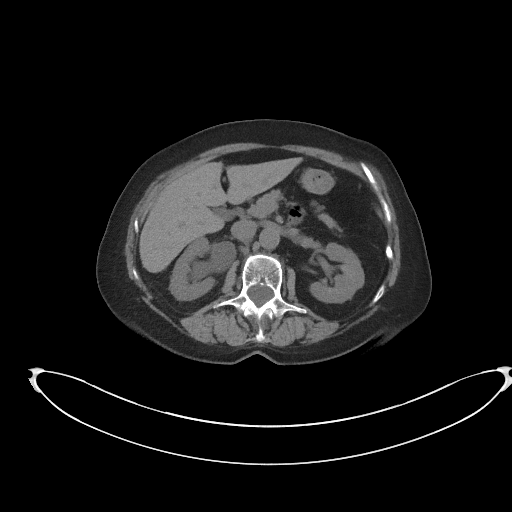
[im 67/84  soft-tissue]
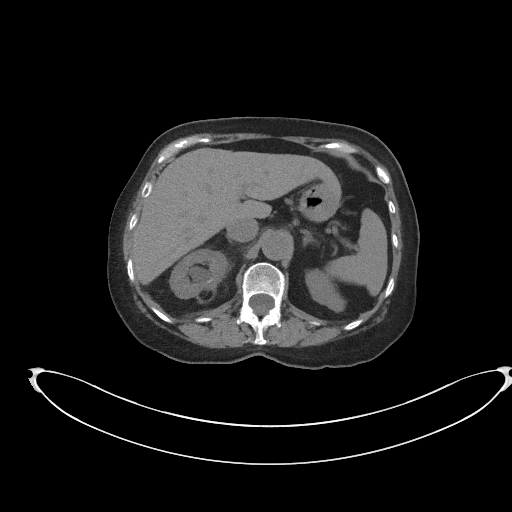
[im 73/84  soft-tissue]
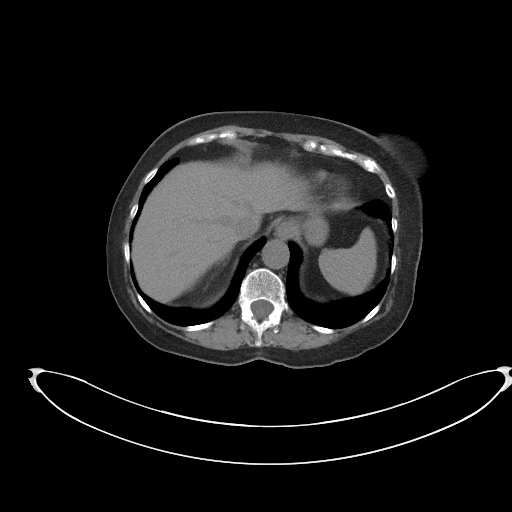
[im 78/84  soft-tissue]
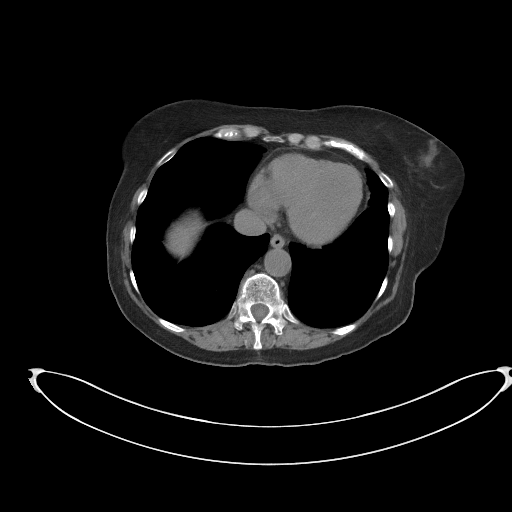

[Series 4: coronal st · coronal · 0.78mm/px · 3 of 101 slices shown]
[im 34/101  soft-tissue]
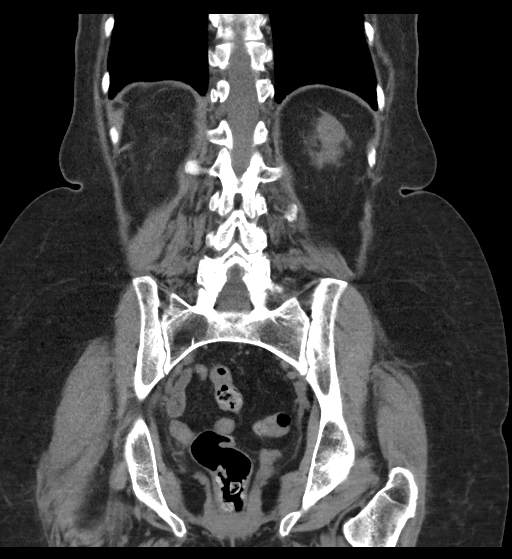
[im 45/101  soft-tissue]
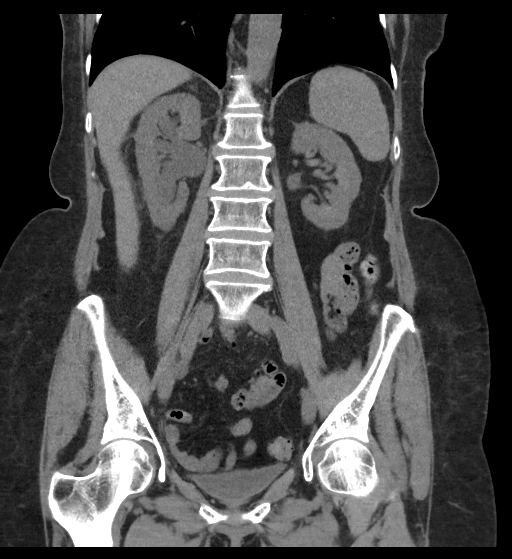
[im 56/101  soft-tissue]
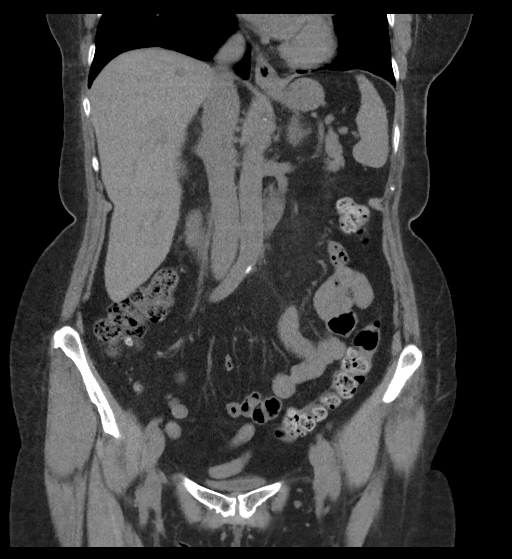

[16 of 46 positions shown; findings below may reference images not displayed]

FINDINGS: Evaluation of this exam is limited in the absence of intravenous
contrast.

Lower chest: The visualized lung bases are clear.

No intra-abdominal free air or free fluid.

Hepatobiliary: The liver is unremarkable. No intrahepatic biliary
dilatation. Noncalcified gallstone. No pericholecystic fluid or
evidence of acute cholecystitis by CT.

Pancreas: Unremarkable. No pancreatic ductal dilatation or
surrounding inflammatory changes.

Spleen: Normal in size without focal abnormality.

Adrenals/Urinary Tract: The adrenal glands are unremarkable. There
is a 1.7 cm rounded area along the medial upper pole of the right
kidney with central fat similar to prior CT. This may represent an
angiomyolipoma or post treatment changes. There is a 6 mm
nonobstructing left renal upper pole calculus. No hydronephrosis on
the left. Additional punctate nonobstructing left renal inferior
pole calculus noted.

There is a 5 mm linear stone in the distal right ureter with mild
right hydronephrosis. The urinary bladder is grossly unremarkable.

Stomach/Bowel: There is sigmoid diverticulosis without active
inflammatory changes. There is moderate stool throughout the colon.
No bowel obstruction or active inflammation. The appendix is normal.

Vascular/Lymphatic: Mild aortoiliac atherosclerotic disease. The IVC
is unremarkable. No portal venous gas. There is no adenopathy.

Reproductive: Hysterectomy.  No adnexal masses.

Other: None

Musculoskeletal: No acute or significant osseous findings.
IMPRESSION: 1. A 5 mm distal right ureteral stone with mild right
hydronephrosis.
2. Nonobstructing left renal calculi. No hydronephrosis on the left.
3. Sigmoid diverticulosis. No bowel obstruction. Normal appendix.
4. Cholelithiasis.
5. Aortic Atherosclerosis ([id number]).
